# Patient Record
Sex: Male | Born: 1950 | Race: Black or African American | Hispanic: No | Marital: Married | Smoking: Never smoker
Health system: Southern US, Community
[De-identification: ages and names within clinical notes are randomized; demographics above are authoritative.]

## PROBLEM LIST (undated history)

## (undated) DIAGNOSIS — J302 Other seasonal allergic rhinitis: Secondary | ICD-10-CM

## (undated) DIAGNOSIS — J449 Chronic obstructive pulmonary disease, unspecified: Secondary | ICD-10-CM

## (undated) DIAGNOSIS — J45909 Unspecified asthma, uncomplicated: Secondary | ICD-10-CM

## (undated) DIAGNOSIS — G4730 Sleep apnea, unspecified: Secondary | ICD-10-CM

## (undated) DIAGNOSIS — N289 Disorder of kidney and ureter, unspecified: Secondary | ICD-10-CM

## (undated) DIAGNOSIS — J42 Unspecified chronic bronchitis: Secondary | ICD-10-CM

## (undated) DIAGNOSIS — I10 Essential (primary) hypertension: Secondary | ICD-10-CM

## (undated) HISTORY — PX: NECK SURGERY: SHX720

## (undated) HISTORY — PX: OTHER SURGICAL HISTORY: SHX169

## (undated) HISTORY — PX: COLOSTOMY REVERSAL: SHX5782

---

## 2004-01-23 ENCOUNTER — Emergency Department (HOSPITAL_COMMUNITY): Admission: EM | Admit: 2004-01-23 | Discharge: 2004-01-23 | Payer: Self-pay | Admitting: *Deleted

## 2004-01-24 ENCOUNTER — Emergency Department (HOSPITAL_COMMUNITY): Admission: EM | Admit: 2004-01-24 | Discharge: 2004-01-24 | Payer: Self-pay | Admitting: *Deleted

## 2004-02-22 ENCOUNTER — Inpatient Hospital Stay (HOSPITAL_COMMUNITY): Admission: RE | Admit: 2004-02-22 | Discharge: 2004-02-25 | Payer: Self-pay | Admitting: Neurological Surgery

## 2004-03-19 ENCOUNTER — Encounter: Admission: RE | Admit: 2004-03-19 | Discharge: 2004-03-19 | Payer: Self-pay | Admitting: Neurological Surgery

## 2004-04-16 ENCOUNTER — Encounter: Admission: RE | Admit: 2004-04-16 | Discharge: 2004-04-16 | Payer: Self-pay | Admitting: Unknown Physician Specialty

## 2004-06-11 ENCOUNTER — Encounter: Admission: RE | Admit: 2004-06-11 | Discharge: 2004-06-11 | Payer: Self-pay | Admitting: Neurological Surgery

## 2004-06-17 ENCOUNTER — Encounter: Admission: RE | Admit: 2004-06-17 | Discharge: 2004-06-17 | Payer: Self-pay | Admitting: Neurological Surgery

## 2004-09-09 ENCOUNTER — Encounter: Admission: RE | Admit: 2004-09-09 | Discharge: 2004-09-09 | Payer: Self-pay | Admitting: Neurological Surgery

## 2004-11-11 ENCOUNTER — Encounter: Admission: RE | Admit: 2004-11-11 | Discharge: 2004-11-11 | Payer: Self-pay | Admitting: Neurological Surgery

## 2005-02-24 ENCOUNTER — Encounter: Admission: RE | Admit: 2005-02-24 | Discharge: 2005-02-24 | Payer: Self-pay | Admitting: Neurological Surgery

## 2005-03-12 ENCOUNTER — Encounter: Admission: RE | Admit: 2005-03-12 | Discharge: 2005-03-12 | Payer: Self-pay | Admitting: Neurological Surgery

## 2005-03-25 ENCOUNTER — Encounter: Admission: RE | Admit: 2005-03-25 | Discharge: 2005-03-25 | Payer: Self-pay | Admitting: Neurological Surgery

## 2005-11-07 ENCOUNTER — Emergency Department (HOSPITAL_COMMUNITY): Admission: EM | Admit: 2005-11-07 | Discharge: 2005-11-07 | Payer: Self-pay | Admitting: Emergency Medicine

## 2006-04-20 ENCOUNTER — Emergency Department (HOSPITAL_COMMUNITY): Admission: EM | Admit: 2006-04-20 | Discharge: 2006-04-20 | Payer: Self-pay | Admitting: Emergency Medicine
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
## 2007-03-24 ENCOUNTER — Emergency Department (HOSPITAL_COMMUNITY): Admission: EM | Admit: 2007-03-24 | Discharge: 2007-03-24 | Payer: Self-pay | Admitting: Emergency Medicine

## 2007-08-29 ENCOUNTER — Emergency Department (HOSPITAL_COMMUNITY): Admission: EM | Admit: 2007-08-29 | Discharge: 2007-08-29 | Payer: Self-pay | Admitting: Emergency Medicine

## 2007-11-15 ENCOUNTER — Emergency Department (HOSPITAL_BASED_OUTPATIENT_CLINIC_OR_DEPARTMENT_OTHER): Admission: EM | Admit: 2007-11-15 | Discharge: 2007-11-15 | Payer: Self-pay | Admitting: Emergency Medicine

## 2007-11-21 ENCOUNTER — Emergency Department (HOSPITAL_BASED_OUTPATIENT_CLINIC_OR_DEPARTMENT_OTHER): Admission: EM | Admit: 2007-11-21 | Discharge: 2007-11-21 | Payer: Self-pay | Admitting: Emergency Medicine

## 2009-03-02 ENCOUNTER — Emergency Department (HOSPITAL_BASED_OUTPATIENT_CLINIC_OR_DEPARTMENT_OTHER): Admission: EM | Admit: 2009-03-02 | Discharge: 2009-03-02 | Payer: Self-pay | Admitting: Emergency Medicine

## 2009-05-15 ENCOUNTER — Ambulatory Visit: Payer: Self-pay | Admitting: Interventional Radiology

## 2009-05-15 ENCOUNTER — Emergency Department (HOSPITAL_BASED_OUTPATIENT_CLINIC_OR_DEPARTMENT_OTHER): Admission: EM | Admit: 2009-05-15 | Discharge: 2009-05-15 | Payer: Self-pay | Admitting: Emergency Medicine

## 2009-09-12 ENCOUNTER — Ambulatory Visit: Payer: Self-pay | Admitting: Radiology

## 2009-09-12 ENCOUNTER — Emergency Department (HOSPITAL_BASED_OUTPATIENT_CLINIC_OR_DEPARTMENT_OTHER): Admission: EM | Admit: 2009-09-12 | Discharge: 2009-09-12 | Payer: Self-pay | Admitting: Emergency Medicine

## 2009-11-16 ENCOUNTER — Emergency Department (HOSPITAL_BASED_OUTPATIENT_CLINIC_OR_DEPARTMENT_OTHER): Admission: EM | Admit: 2009-11-16 | Discharge: 2009-11-16 | Payer: Self-pay | Admitting: Emergency Medicine

## 2009-11-28 ENCOUNTER — Emergency Department (HOSPITAL_BASED_OUTPATIENT_CLINIC_OR_DEPARTMENT_OTHER): Admission: EM | Admit: 2009-11-28 | Discharge: 2009-11-28 | Payer: Self-pay | Admitting: Emergency Medicine

## 2010-01-05 ENCOUNTER — Emergency Department (HOSPITAL_BASED_OUTPATIENT_CLINIC_OR_DEPARTMENT_OTHER): Admission: EM | Admit: 2010-01-05 | Discharge: 2010-01-05 | Payer: Self-pay | Admitting: Emergency Medicine

## 2010-01-05 ENCOUNTER — Ambulatory Visit: Payer: Self-pay | Admitting: Radiology

## 2010-05-25 ENCOUNTER — Encounter: Payer: Self-pay | Admitting: Neurological Surgery

## 2010-07-23 LAB — URINALYSIS, ROUTINE W REFLEX MICROSCOPIC
Bilirubin Urine: NEGATIVE
Glucose, UA: NEGATIVE mg/dL
Hgb urine dipstick: NEGATIVE
Ketones, ur: NEGATIVE mg/dL
Nitrite: NEGATIVE
Protein, ur: NEGATIVE mg/dL
Specific Gravity, Urine: 1.027 (ref 1.005–1.030)
Urobilinogen, UA: 0.2 mg/dL (ref 0.0–1.0)
pH: 5.5 (ref 5.0–8.0)

## 2010-07-23 LAB — CBC
HCT: 43.8 % (ref 39.0–52.0)
Hemoglobin: 14.4 g/dL (ref 13.0–17.0)
MCHC: 33 g/dL (ref 30.0–36.0)
MCV: 86.9 fL (ref 78.0–100.0)
Platelets: 277 10*3/uL (ref 150–400)
RBC: 5.04 MIL/uL (ref 4.22–5.81)
RDW: 13.8 % (ref 11.5–15.5)
WBC: 8.3 10*3/uL (ref 4.0–10.5)

## 2010-07-23 LAB — COMPREHENSIVE METABOLIC PANEL
ALT: 53 U/L (ref 0–53)
AST: 53 U/L — ABNORMAL HIGH (ref 0–37)
Albumin: 3.9 g/dL (ref 3.5–5.2)
Alkaline Phosphatase: 70 U/L (ref 39–117)
BUN: 16 mg/dL (ref 6–23)
CO2: 27 mEq/L (ref 19–32)
Calcium: 9.4 mg/dL (ref 8.4–10.5)
Chloride: 105 mEq/L (ref 96–112)
Creatinine, Ser: 1.1 mg/dL (ref 0.4–1.5)
GFR calc Af Amer: >60 mL/min (ref >60)
GFR calc non Af Amer: >60 mL/min (ref >60)
Glucose, Bld: 105 mg/dL — ABNORMAL HIGH (ref 70–99)
Potassium: 4.5 mEq/L (ref 3.5–5.1)
Sodium: 142 mEq/L (ref 135–145)
Total Bilirubin: 0.5 mg/dL (ref 0.3–1.2)
Total Protein: 7.7 g/dL (ref 6.0–8.3)

## 2010-07-23 LAB — DIFFERENTIAL
Basophils Absolute: 0.1 10*3/uL (ref 0.0–0.1)
Basophils Relative: 1 % (ref 0–1)
Eosinophils Absolute: 0.6 10*3/uL (ref 0.0–0.7)
Eosinophils Relative: 7 % — ABNORMAL HIGH (ref 0–5)
Lymphocytes Relative: 37 % (ref 12–46)
Lymphs Abs: 3.1 10*3/uL (ref 0.7–4.0)
Monocytes Absolute: 0.8 10*3/uL (ref 0.1–1.0)
Monocytes Relative: 9 % (ref 3–12)
Neutro Abs: 3.7 10*3/uL (ref 1.7–7.7)
Neutrophils Relative %: 47 % (ref 43–77)

## 2010-07-23 LAB — D-DIMER, QUANTITATIVE: D-Dimer, Quant: 0.26 ug/mL-FEU (ref 0.00–0.48)

## 2010-07-23 LAB — POCT B-TYPE NATRIURETIC PEPTIDE (BNP): B Natriuretic Peptide, POC: <5 pg/mL (ref 0–100)

## 2010-09-20 NOTE — Discharge Summary (Signed)
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   PROCEDURES:  Posterior lumbar interbody fusion at L4-5.   BRIEF HISTORY OF PRESENT ILLNESS:  Marvin Romero is a 60 year old black male  who was referred to the neurosurgical unit with complaints of back pain with  leg pain.  He had an MRI which showed degenerative disk disease at L4-5 with  lumbar spinal stenosis at L4-5.  He tried medical management for quite some  time without significant relief.  I recommended a posterior lumbar interbody  fusion at L4-5.  He understood the risks, benefits and alternatives and  wished to proceed.   HOSPITAL COURSE:  The patient was admitted on February 22, 2004, and taken to  the operating room where he underwent a posterior lumbar interbody fusion at  L4-5.  The patient tolerated the procedure well.  He was taken to the  recovery room and then to the floor in stable condition.  For details of the  operative procedure, please see the dictated operative note.  The patient's  hospital course was routine.  There were no complications.  His Foley  catheter was discontinued on the first evening.  He was able to ambulate to  the bathroom without difficulty.  The following day, he was ambulating in  the hallways without difficulty.  He had some back soreness, but no  significant leg pain.  He had no weakness on exam.  His incision was clean,  dry and intact.  His diet was advanced to a regular diet and he tolerated  this well.  He continued on this course and was discharged to home in stable  condition on February 25, 2004.   DISCHARGE MEDICATIONS:  1.  Percocet one to two p.o. q.6h. p.r.n. pain, 100 pills and no refills.  2.  Flexeril 10 mg p.o. t.i.d., 60 pills and two refills.   FOLLOWUP:  His return office visit is in two weeks with Dr. Yetta Barre.       DSJ/MEDQ  D:  02/25/2004  T:  02/25/2004  Job:  [id number]

## 2010-09-20 NOTE — Op Note (Signed)
NAME:  Mccandlish, Lauren            ACCOUNT NO.:  [id number]   MEDICAL RECORD NO.:  [id number]          PATIENT TYPE:  INP   LOCATION:  2899                         FACILITY:  [facility]   PHYSICIAN:  Tia Alert, MD     DATE OF BIRTH:  12/11/1950   DATE OF PROCEDURE:  02/22/2004  DATE OF DISCHARGE:                                 OPERATIVE REPORT   PREOPERATIVE DIAGNOSIS:  Lumbar disk herniation with degenerative disk  disease and lumbar spinal stenosis, L4-5, with back pain and leg pain.   POSTOPERATIVE DIAGNOSIS:  Lumbar disk herniation with degenerative disk  disease and lumbar spinal stenosis, L4-5, with back pain and leg pain.   PROCEDURE:  1.  Decompressive lumbar laminectomy, hemi-facetectomy and foraminotomies,      L4-5, bilaterally, for central canal nerve root decompression.  2.  Posterior lumbar interbody fusion, L4-5, utilizing 10 x 20-mm allograft      bone wedges and locally harvested morcelized autologous bone graft.  3.  Intertransverse arthrodesis at L4-5 utilizing autograft.  4.  Nonsegmental fixation, L4-5, utilizing [employer] pedicle screw and      rod fixation.   SURGEON:  Tia Alert, M.D.   ASSISTANT:  Donalee Citrin, M.D.   ANESTHESIA:  General endotracheal.   COMPLICATIONS:  None apparent.   INDICATION FOR THE PROCEDURE:  Mr. Docken is a 60 year old black male who  was referred with a work-related back injury.  He had a large midline disk  herniation with degenerative disk disease with Modic end-plate changes at L4-  5.  He had tried medical management for quite some without significant  relief.  I recommended a posterior lumbar interbody fusion at L4-5 to  address both his degenerative disk disease, back pain and the lumbar disk  herniation.  He understood the risks, benefits and alternatives, and wished  to proceed.   DESCRIPTION OF THE PROCEDURE:  The patient was taken to the operating room  and after induction of adequate general  endotracheal anesthesia, he was  rolled in a prone position on the Wilson frame and all pressure points were  padded.  His lumbar region was prepped with Duraprep and then draped in the  usual sterile fashion.  Ten milliliters of local anesthesia were injected  and then a dorsal midline incision was made and carried down to the  lumbosacral fascia, which was then opened and the paraspinous musculature  was taken down in a subperiosteal fashion to expose the L4-5 interspace  bilaterally, which carried out the dissection out over the facets and  identified the transverse processes of L4 bilaterally.  Intraoperative  fluoroscopy confirmed my level and then the spinous process was removed with  a Leksell rongeur, and then a complete laminectomy, hemi-facetectomy and  foraminotomies were performed at L4-5.  The yellow ligament was removed.  The underlying L4 and L5 nerve roots were identified, and followed out into  their respective foramina.  The epidural venous vasculature was coagulated  to expose a large subannular disk herniation.  The annulus was incised  bilaterally and a thorough intradiskal diskectomy was performed with  pituitary rongeurs,  curettes, and rotating cutters.  We then distracted the  disk space up to 12 mm and used a 12-mm cutting chisel on the patient's  right side to prepare the endplates for arthrodesis, and then placed a 12 x  20-mm allograft bone wedge into the interspace at L4-5 on the right side.  We then packed the midline from the left side with autograft and then used  the cutting chisel, and placed another 12 x 20-mm allograft bone wedge into  the interspace at L4-5 from the left side.  Once the PLIF was completed, we  turned out attention to the nonsegmental fixation.  We localized the pedicle  screw entry zones at L4 and L5 bilaterally utilizing fluoroscopy and our  knowledge of the anatomy and then probed each pedicle, tapped each pedicle  with a 5.5 tap, and  then placed six 5 x 45-mm pedicle screw into the L4 and  L5 pedicles bilaterally.  We then used 2 lordotic rods and placed these into  the multiaxial screw heads of the pedicle screws, and locked these in  position with the locking caps and anti-torque device after achieving  compression.  We then decorticated the transverse process at L4 on the left  and the lateral part of the facet at L5, and placed autograft out over these  for intertransverse arthrodesis on the left side.  We then irrigated with  copious amounts of Bacitracin-containing saline solution, checked the nerve  roots once again and then closed the muscle and the fascia with interrupted  #1 Vicryl, closed the subcutaneous and subcuticular tissue with 2-0 and 3-0  Vicryl, and closed the skin with Benzoin and Steri-Strips.  A medium Hemovac  drain was placed through a separate stab incision in the subfascial space.  The patient was awakened from general anesthesia and transported to the  recovery room in stable condition.  At the end of the procedure, all sponge,  needle and instrument counts were correct.       DSJ/MEDQ  D:  02/22/2004  T:  02/22/2004  Job:  [id number]

## 2011-01-28 LAB — COMPREHENSIVE METABOLIC PANEL
ALT: 22
Alkaline Phosphatase: 62
CO2: 26
Chloride: 98
GFR calc non Af Amer: 56 — ABNORMAL LOW
Glucose, Bld: 103 — ABNORMAL HIGH
Potassium: 3.5
Sodium: 134 — ABNORMAL LOW
Total Bilirubin: 0.8

## 2011-01-28 LAB — URINALYSIS, ROUTINE W REFLEX MICROSCOPIC
Bilirubin Urine: NEGATIVE
Hgb urine dipstick: NEGATIVE
Specific Gravity, Urine: 1.035 — ABNORMAL HIGH
pH: 5.5

## 2011-05-01 ENCOUNTER — Other Ambulatory Visit: Payer: Self-pay

## 2011-05-01 ENCOUNTER — Emergency Department (HOSPITAL_BASED_OUTPATIENT_CLINIC_OR_DEPARTMENT_OTHER)
Admission: EM | Admit: 2011-05-01 | Discharge: 2011-05-01 | Disposition: A | Discharge status: Home / Self Care | Payer: Medicare Other | Attending: Emergency Medicine | Admitting: Emergency Medicine

## 2011-05-01 DIAGNOSIS — R1110 Vomiting, unspecified: Secondary | ICD-10-CM | POA: Insufficient documentation

## 2011-05-01 DIAGNOSIS — R42 Dizziness and giddiness: Secondary | ICD-10-CM | POA: Insufficient documentation

## 2011-05-01 DIAGNOSIS — I10 Essential (primary) hypertension: Secondary | ICD-10-CM | POA: Insufficient documentation

## 2011-05-01 HISTORY — DX: Essential (primary) hypertension: I10

## 2011-05-01 MED ORDER — MECLIZINE HCL 25 MG PO TABS
ORAL_TABLET | ORAL | Status: AC
  Administered 2011-05-01: 25 mg
  Filled 2011-05-01: qty 1

## 2011-05-01 MED ORDER — MECLIZINE HCL 25 MG PO TABS: 25.0000 mg | ORAL_TABLET | Freq: Three times a day (TID) | ORAL | Status: AC | PRN

## 2011-05-01 NOTE — ED Notes (Signed)
Pt reports he awakened this am with dizziness and feeling "light headed".  Denies dizziness now.

## 2011-05-01 NOTE — ED Notes (Signed)
Pt vomited moderate amount of emesis.

## 2011-05-01 NOTE — ED Notes (Signed)
MD at bedside. 

## 2011-05-01 NOTE — ED Provider Notes (Signed)
History     CSN: [id number]  Arrival date & time 05/01/11  0716   First MD Initiated Contact with Patient 05/01/11 [phone]      Chief Complaint  Patient presents with  . Dizziness    (Consider location/radiation/quality/duration/timing/severity/associated sxs/prior treatment) HPI Comments: Patient states that he woke this morning feeling dizzy, like the room was spinning.  This lasted several minutes, then resolved.  He was on his way to the gym and stopped here to be checked.  He denies chest pain, shortness of breath.  No abd pain.  No ill contacts.  Patient is a 60 y.o. male presenting with vomiting. The history is provided by the patient.  Emesis  This is a new problem. The current episode started less than 1 hour ago. The problem has been resolved. The emesis has an appearance of stomach contents. There has been no fever. Pertinent negatives include no chills, no fever and no headaches.    Past Medical History  Diagnosis Date  . Hypertension     Past Surgical History  Procedure Date  . Colonostomy     No family history on file.  History  Substance Use Topics  . Smoking status: Never Smoker   . Smokeless tobacco: Never Used  . Alcohol Use: No      Review of Systems  Constitutional: Negative for fever and chills.  HENT: Negative for neck pain.   Gastrointestinal: Positive for vomiting.  Neurological: Positive for dizziness. Negative for headaches.  All other systems reviewed and are negative.    Allergies  Review of patient's allergies indicates no known allergies.  Home Medications  No current outpatient prescriptions on file.  BP 143/99  Pulse 77  Temp(Src) 97.5 F (36.4 C) (Oral)  Resp 16  Ht 6' (1.829 m)  Wt 215 lb (97.523 kg)  BMI 29.16 kg/m2  SpO2 99%  Physical Exam  Nursing note and vitals reviewed. Constitutional: He is oriented to person, place, and time. He appears well-developed and well-nourished. No distress.  HENT:  Head:  Normocephalic and atraumatic.  Right Ear: External ear normal.  Left Ear: External ear normal.  Mouth/Throat: Oropharynx is clear and moist. No oropharyngeal exudate.  Eyes: EOM are normal. Pupils are equal, round, and reactive to light.  Neck: Normal range of motion. Neck supple.  Cardiovascular: Normal rate and regular rhythm.   No murmur heard. Pulmonary/Chest: Effort normal and breath sounds normal. No respiratory distress. He has no wheezes.  Abdominal: Soft. Bowel sounds are normal. He exhibits no distension. There is no tenderness.  Musculoskeletal: Normal range of motion. He exhibits no edema.  Lymphadenopathy:    He has no cervical adenopathy.  Neurological: He is alert and oriented to person, place, and time. No cranial nerve deficit. Coordination normal.       There is no nystagmus, either vertical or horizontal.    Skin: Skin is warm and dry. He is not diaphoretic.    ED Course  Procedures (including critical care time)  Labs Reviewed - No data to display No results found.   No diagnosis found.   Date: 05/01/2011  Rate: 76  Rhythm: normal sinus rhythm  QRS Axis: normal  Intervals: normal  ST/T Wave abnormalities: normal  Conduction Disutrbances:none  Narrative Interpretation:   Old EKG Reviewed: none available    MDM  The patient arrived to the ED after experiencing dizzy episode at home that sounds like peripheral vertigo.  While being examined, he became nauseated and vomited.  He then  felt much better and did not want to undergo any tests.  He wanted to be discharged.  He did have an ekg done which was normal.  I explained to him that I could not rule out more sinister causes of dizziness without doing further testing, however he still wanted to go home, stating that he would come back if he got worse.          Geoffery Lyons, MD 05/01/11 [phone]

## 2012-05-21 ENCOUNTER — Emergency Department (HOSPITAL_BASED_OUTPATIENT_CLINIC_OR_DEPARTMENT_OTHER)
Admission: EM | Admit: 2012-05-21 | Discharge: 2012-05-21 | Disposition: A | Discharge status: Home / Self Care | Payer: Medicare HMO | Attending: Emergency Medicine | Admitting: Emergency Medicine

## 2012-05-21 ENCOUNTER — Encounter (HOSPITAL_BASED_OUTPATIENT_CLINIC_OR_DEPARTMENT_OTHER): Payer: Self-pay | Admitting: Emergency Medicine

## 2012-05-21 ENCOUNTER — Emergency Department (HOSPITAL_BASED_OUTPATIENT_CLINIC_OR_DEPARTMENT_OTHER): Payer: Medicare HMO

## 2012-05-21 DIAGNOSIS — J9801 Acute bronchospasm: Secondary | ICD-10-CM | POA: Insufficient documentation

## 2012-05-21 DIAGNOSIS — R059 Cough, unspecified: Secondary | ICD-10-CM | POA: Insufficient documentation

## 2012-05-21 DIAGNOSIS — Z8669 Personal history of other diseases of the nervous system and sense organs: Secondary | ICD-10-CM | POA: Insufficient documentation

## 2012-05-21 DIAGNOSIS — R062 Wheezing: Secondary | ICD-10-CM | POA: Insufficient documentation

## 2012-05-21 DIAGNOSIS — R05 Cough: Secondary | ICD-10-CM | POA: Insufficient documentation

## 2012-05-21 DIAGNOSIS — J40 Bronchitis, not specified as acute or chronic: Secondary | ICD-10-CM

## 2012-05-21 DIAGNOSIS — I10 Essential (primary) hypertension: Secondary | ICD-10-CM | POA: Insufficient documentation

## 2012-05-21 HISTORY — DX: Sleep apnea, unspecified: G47.30

## 2012-05-21 MED ORDER — PREDNISONE 20 MG PO TABS: 60.0000 mg | ORAL_TABLET | Freq: Every day | ORAL | Status: DC

## 2012-05-21 MED ORDER — ALBUTEROL SULFATE HFA 108 (90 BASE) MCG/ACT IN AERS: 2.0000 | INHALATION_SPRAY | RESPIRATORY_TRACT | Status: DC | PRN

## 2012-05-21 MED ORDER — ALBUTEROL SULFATE (5 MG/ML) 0.5% IN NEBU
5.0000 mg | INHALATION_SOLUTION | Freq: Once | RESPIRATORY_TRACT | Status: AC
  Administered 2012-05-21: 5 mg via RESPIRATORY_TRACT
  Filled 2012-05-21: qty 1

## 2012-05-21 MED ORDER — PREDNISONE 50 MG PO TABS
60.0000 mg | ORAL_TABLET | Freq: Once | ORAL | Status: AC
  Administered 2012-05-21: 60 mg via ORAL
  Filled 2012-05-21: qty 1

## 2012-05-21 MED ORDER — ALBUTEROL SULFATE HFA 108 (90 BASE) MCG/ACT IN AERS
INHALATION_SPRAY | RESPIRATORY_TRACT | Status: AC
  Administered 2012-05-21
  Filled 2012-05-21: qty 6.7

## 2012-05-21 MED ORDER — IPRATROPIUM BROMIDE 0.02 % IN SOLN
0.5000 mg | Freq: Once | RESPIRATORY_TRACT | Status: AC
  Administered 2012-05-21: 0.5 mg via RESPIRATORY_TRACT
  Filled 2012-05-21: qty 2.5

## 2012-05-21 MED ORDER — ALBUTEROL SULFATE (5 MG/ML) 0.5% IN NEBU
2.5000 mg | INHALATION_SOLUTION | Freq: Once | RESPIRATORY_TRACT | Status: AC
  Administered 2012-05-21: 2.5 mg via RESPIRATORY_TRACT
  Filled 2012-05-21: qty 0.5

## 2012-05-21 NOTE — ED Notes (Signed)
Shortness of breath, cough and wheezing since 04/18/12.  Worse in the last couple of days.
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
## 2012-12-19 ENCOUNTER — Emergency Department (HOSPITAL_BASED_OUTPATIENT_CLINIC_OR_DEPARTMENT_OTHER)
Admission: EM | Admit: 2012-12-19 | Discharge: 2012-12-19 | Disposition: A | Discharge status: Home / Self Care | Payer: Medicare HMO | Attending: Emergency Medicine | Admitting: Emergency Medicine

## 2012-12-19 ENCOUNTER — Emergency Department (HOSPITAL_BASED_OUTPATIENT_CLINIC_OR_DEPARTMENT_OTHER): Payer: Medicare HMO

## 2012-12-19 ENCOUNTER — Encounter (HOSPITAL_BASED_OUTPATIENT_CLINIC_OR_DEPARTMENT_OTHER): Payer: Self-pay

## 2012-12-19 DIAGNOSIS — R05 Cough: Secondary | ICD-10-CM | POA: Insufficient documentation

## 2012-12-19 DIAGNOSIS — R062 Wheezing: Secondary | ICD-10-CM | POA: Insufficient documentation

## 2012-12-19 DIAGNOSIS — J9801 Acute bronchospasm: Secondary | ICD-10-CM | POA: Insufficient documentation

## 2012-12-19 DIAGNOSIS — R059 Cough, unspecified: Secondary | ICD-10-CM | POA: Insufficient documentation

## 2012-12-19 DIAGNOSIS — T7840XA Allergy, unspecified, initial encounter: Secondary | ICD-10-CM

## 2012-12-19 DIAGNOSIS — I10 Essential (primary) hypertension: Secondary | ICD-10-CM | POA: Insufficient documentation

## 2012-12-19 DIAGNOSIS — L272 Dermatitis due to ingested food: Secondary | ICD-10-CM | POA: Insufficient documentation

## 2012-12-19 HISTORY — DX: Other seasonal allergic rhinitis: J30.2

## 2012-12-19 LAB — CBC WITH DIFFERENTIAL/PLATELET
Basophils Absolute: 0 10*3/uL (ref 0.0–0.1)
Lymphocytes Relative: 52 % — ABNORMAL HIGH (ref 12–46)
Lymphs Abs: 6.6 10*3/uL — ABNORMAL HIGH (ref 0.7–4.0)
MCV: 86.4 fL (ref 78.0–100.0)
Monocytes Relative: 9 % (ref 3–12)
Platelets: 217 10*3/uL (ref 150–400)
RDW: 14.7 % (ref 11.5–15.5)
WBC: 12.7 10*3/uL — ABNORMAL HIGH (ref 4.0–10.5)

## 2012-12-19 LAB — TROPONIN I: Troponin I: <0.3 ng/mL (ref <0.30)

## 2012-12-19 LAB — COMPREHENSIVE METABOLIC PANEL
Albumin: 3.7 g/dL (ref 3.5–5.2)
Alkaline Phosphatase: 72 U/L (ref 39–117)
BUN: 10 mg/dL (ref 6–23)
Creatinine, Ser: 1.1 mg/dL (ref 0.50–1.35)
Potassium: 3.5 mEq/L (ref 3.5–5.1)
Total Protein: 7.2 g/dL (ref 6.0–8.3)

## 2012-12-19 LAB — PRO B NATRIURETIC PEPTIDE: Pro B Natriuretic peptide (BNP): 22 pg/mL (ref 0–125)

## 2012-12-19 MED ORDER — ALBUTEROL SULFATE (5 MG/ML) 0.5% IN NEBU
INHALATION_SOLUTION | RESPIRATORY_TRACT | Status: AC
  Administered 2012-12-19: 5 mg via RESPIRATORY_TRACT
  Filled 2012-12-19: qty 1

## 2012-12-19 MED ORDER — IPRATROPIUM BROMIDE 0.02 % IN SOLN: 0.5000 mg | Freq: Once | RESPIRATORY_TRACT | Status: DC

## 2012-12-19 MED ORDER — ALBUTEROL SULFATE (5 MG/ML) 0.5% IN NEBU
15.0000 mg | INHALATION_SOLUTION | RESPIRATORY_TRACT | Status: DC
  Administered 2012-12-19: 15 mg via RESPIRATORY_TRACT

## 2012-12-19 MED ORDER — ALBUTEROL SULFATE HFA 108 (90 BASE) MCG/ACT IN AERS: 2.0000 | INHALATION_SPRAY | RESPIRATORY_TRACT | Status: DC | PRN

## 2012-12-19 MED ORDER — PREDNISONE 50 MG PO TABS: ORAL_TABLET | ORAL | Status: DC

## 2012-12-19 MED ORDER — EPINEPHRINE 0.3 MG/0.3ML IJ SOAJ: 0.3000 mg | INTRAMUSCULAR | Status: DC | PRN

## 2012-12-19 MED ORDER — LEVOFLOXACIN IN D5W 500 MG/100ML IV SOLN: 500.0000 mg | Freq: Once | INTRAVENOUS | Status: DC

## 2012-12-19 MED ORDER — PREDNISONE 50 MG PO TABS
60.0000 mg | ORAL_TABLET | Freq: Once | ORAL | Status: AC
  Administered 2012-12-19: 60 mg via ORAL
  Filled 2012-12-19: qty 1

## 2012-12-19 MED ORDER — ALBUTEROL SULFATE (5 MG/ML) 0.5% IN NEBU
5.0000 mg | INHALATION_SOLUTION | Freq: Once | RESPIRATORY_TRACT | Status: AC
  Administered 2012-12-19: 5 mg via RESPIRATORY_TRACT

## 2012-12-19 MED ORDER — MAGNESIUM SULFATE 50 % IJ SOLN
2.0000 g | Freq: Once | INTRAMUSCULAR | Status: AC
  Administered 2012-12-19: 2 g via INTRAVENOUS
  Filled 2012-12-19: qty 4

## 2012-12-19 MED ORDER — ALBUTEROL SULFATE (5 MG/ML) 0.5% IN NEBU
INHALATION_SOLUTION | RESPIRATORY_TRACT | Status: AC
  Filled 2012-12-19: qty 0.5

## 2012-12-19 NOTE — ED Notes (Signed)
Patient reports that he was awakened at midnight with a feeling of mucus stuck in his throat. On arrival audible wheezing and dry cough with tachypnea. Speaking complete sentences. RT and MD at bedside on arrival

## 2012-12-19 NOTE — ED Notes (Signed)
Walked patient with pulse ox around dept. Patient sats were 96-97%. Heart rate of 113. Patient in no distress or SOB. Returned patient back to bed and placed on monitor. RN notified.

## 2012-12-19 NOTE — ED Notes (Signed)
Continuous neb in progress. No distress. States he feels much better. 02 sats 97%
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
## 2012-12-19 NOTE — ED Notes (Signed)
MD with pt  

## 2012-12-20 LAB — PATHOLOGIST SMEAR REVIEW

## 2013-02-28 DIAGNOSIS — K219 Gastro-esophageal reflux disease without esophagitis: Secondary | ICD-10-CM | POA: Insufficient documentation

## 2014-01-04 ENCOUNTER — Emergency Department (HOSPITAL_BASED_OUTPATIENT_CLINIC_OR_DEPARTMENT_OTHER)
Admission: EM | Admit: 2014-01-04 | Discharge: 2014-01-04 | Disposition: A | Discharge status: Home / Self Care | Payer: No Typology Code available for payment source | Attending: Emergency Medicine | Admitting: Emergency Medicine

## 2014-01-04 ENCOUNTER — Emergency Department (HOSPITAL_BASED_OUTPATIENT_CLINIC_OR_DEPARTMENT_OTHER): Payer: No Typology Code available for payment source

## 2014-01-04 ENCOUNTER — Encounter (HOSPITAL_BASED_OUTPATIENT_CLINIC_OR_DEPARTMENT_OTHER): Payer: Self-pay | Admitting: Emergency Medicine

## 2014-01-04 DIAGNOSIS — S6990XA Unspecified injury of unspecified wrist, hand and finger(s), initial encounter: Secondary | ICD-10-CM

## 2014-01-04 DIAGNOSIS — Z8709 Personal history of other diseases of the respiratory system: Secondary | ICD-10-CM | POA: Diagnosis not present

## 2014-01-04 DIAGNOSIS — S5010XA Contusion of unspecified forearm, initial encounter: Secondary | ICD-10-CM | POA: Insufficient documentation

## 2014-01-04 DIAGNOSIS — IMO0002 Reserved for concepts with insufficient information to code with codable children: Secondary | ICD-10-CM | POA: Diagnosis not present

## 2014-01-04 DIAGNOSIS — Y92410 Unspecified street and highway as the place of occurrence of the external cause: Secondary | ICD-10-CM | POA: Diagnosis not present

## 2014-01-04 DIAGNOSIS — I10 Essential (primary) hypertension: Secondary | ICD-10-CM | POA: Diagnosis not present

## 2014-01-04 DIAGNOSIS — S5012XA Contusion of left forearm, initial encounter: Secondary | ICD-10-CM

## 2014-01-04 DIAGNOSIS — Y9389 Activity, other specified: Secondary | ICD-10-CM | POA: Insufficient documentation

## 2014-01-04 DIAGNOSIS — S59909A Unspecified injury of unspecified elbow, initial encounter: Secondary | ICD-10-CM | POA: Insufficient documentation

## 2014-01-04 DIAGNOSIS — S59919A Unspecified injury of unspecified forearm, initial encounter: Secondary | ICD-10-CM

## 2014-01-04 MED ORDER — IBUPROFEN 800 MG PO TABS: 800.0000 mg | ORAL_TABLET | Freq: Three times a day (TID) | ORAL | Status: DC
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
## 2014-01-04 NOTE — Discharge Instructions (Signed)
Contusion °A contusion is a deep bruise. Contusions are the result of an injury that caused bleeding under the skin. The contusion may turn blue, purple, or yellow. Minor injuries will give you a painless contusion, but more severe contusions may stay painful and swollen for a few weeks.  °CAUSES  °A contusion is usually caused by a blow, trauma, or direct force to an area of the body. °SYMPTOMS  °· Swelling and redness of the injured area. °· Bruising of the injured area. °· Tenderness and soreness of the injured area. °· Pain. °DIAGNOSIS  °The diagnosis can be made by taking a history and physical exam. An X-ray, CT scan, or MRI may be needed to determine if there were any associated injuries, such as fractures. °TREATMENT  °Specific treatment will depend on what area of the body was injured. In general, the best treatment for a contusion is resting, icing, elevating, and applying cold compresses to the injured area. Over-the-counter medicines may also be recommended for pain control. Ask your caregiver what the best treatment is for your contusion. °HOME CARE INSTRUCTIONS  °· Put ice on the injured area. °¨ Put ice in a plastic bag. °¨ Place a towel between your skin and the bag. °¨ Leave the ice on for 15-20 minutes, 3-4 times a day, or as directed by your health care provider. °· Only take over-the-counter or prescription medicines for pain, discomfort, or fever as directed by your caregiver. Your caregiver may recommend avoiding anti-inflammatory medicines (aspirin, ibuprofen, and naproxen) for 48 hours because these medicines may increase bruising. °· Rest the injured area. °· If possible, elevate the injured area to reduce swelling. °SEEK IMMEDIATE MEDICAL CARE IF:  °· You have increased bruising or swelling. °· You have pain that is getting worse. °· Your swelling or pain is not relieved with medicines. °MAKE SURE YOU:  °· Understand these instructions. °· Will watch your condition. °· Will get help right  away if you are not doing well or get worse. °Document Released: 01/29/2005 Document Revised: 04/26/2013 Document Reviewed: 02/24/2011 °[employer]® Patient Information ©2015 [employer]. This information is not intended to replace advice given to you by your health care provider. Make sure you discuss any questions you have with your health care provider. ° °

## 2014-01-05 NOTE — ED Provider Notes (Signed)
Medical screening examination/treatment/procedure(s) were performed by non-physician practitioner and as supervising physician I was immediately available for consultation/collaboration.   EKG Interpretation None       Doug Sou, MD 01/05/14 2505

## 2014-06-28 ENCOUNTER — Emergency Department (HOSPITAL_BASED_OUTPATIENT_CLINIC_OR_DEPARTMENT_OTHER): Payer: Medicare HMO

## 2014-06-28 ENCOUNTER — Encounter (HOSPITAL_BASED_OUTPATIENT_CLINIC_OR_DEPARTMENT_OTHER): Payer: Self-pay

## 2014-06-28 ENCOUNTER — Emergency Department (HOSPITAL_BASED_OUTPATIENT_CLINIC_OR_DEPARTMENT_OTHER)
Admission: EM | Admit: 2014-06-28 | Discharge: 2014-06-28 | Disposition: A | Discharge status: Home / Self Care | Payer: Medicare HMO | Attending: Emergency Medicine | Admitting: Emergency Medicine

## 2014-06-28 DIAGNOSIS — R0602 Shortness of breath: Secondary | ICD-10-CM | POA: Diagnosis present

## 2014-06-28 DIAGNOSIS — J40 Bronchitis, not specified as acute or chronic: Secondary | ICD-10-CM

## 2014-06-28 DIAGNOSIS — R000 Tachycardia, unspecified: Secondary | ICD-10-CM | POA: Insufficient documentation

## 2014-06-28 DIAGNOSIS — I10 Essential (primary) hypertension: Secondary | ICD-10-CM | POA: Diagnosis not present

## 2014-06-28 DIAGNOSIS — Z8669 Personal history of other diseases of the nervous system and sense organs: Secondary | ICD-10-CM | POA: Diagnosis not present

## 2014-06-28 DIAGNOSIS — J209 Acute bronchitis, unspecified: Secondary | ICD-10-CM | POA: Insufficient documentation

## 2014-06-28 LAB — CBC WITH DIFFERENTIAL/PLATELET
Basophils Absolute: 0.1 10*3/uL (ref 0.0–0.1)
Basophils Relative: 1 % (ref 0–1)
EOS ABS: 1 10*3/uL — AB (ref 0.0–0.7)
EOS PCT: 8 % — AB (ref 0–5)
HCT: 38 % — ABNORMAL LOW (ref 39.0–52.0)
HEMOGLOBIN: 12.3 g/dL — AB (ref 13.0–17.0)
LYMPHS ABS: 2.5 10*3/uL (ref 0.7–4.0)
Lymphocytes Relative: 20 % (ref 12–46)
MCH: 28.2 pg (ref 26.0–34.0)
MCHC: 32.4 g/dL (ref 30.0–36.0)
MCV: 87.2 fL (ref 78.0–100.0)
MONO ABS: 1.2 10*3/uL — AB (ref 0.1–1.0)
MONOS PCT: 9 % (ref 3–12)
NEUTROS PCT: 62 % (ref 43–77)
Neutro Abs: 8 10*3/uL — ABNORMAL HIGH (ref 1.7–7.7)
Platelets: 339 10*3/uL (ref 150–400)
RBC: 4.36 MIL/uL (ref 4.22–5.81)
RDW: 13.6 % (ref 11.5–15.5)
WBC: 12.7 10*3/uL — AB (ref 4.0–10.5)

## 2014-06-28 LAB — TROPONIN I: Troponin I: 0.03 ng/mL (ref <0.031)

## 2014-06-28 LAB — BASIC METABOLIC PANEL
ANION GAP: 3 — AB (ref 5–15)
BUN: 9 mg/dL (ref 6–23)
CALCIUM: 8.6 mg/dL (ref 8.4–10.5)
CO2: 27 mmol/L (ref 19–32)
Chloride: 106 mmol/L (ref 96–112)
Creatinine, Ser: 1.18 mg/dL (ref 0.50–1.35)
GFR, EST AFRICAN AMERICAN: 75 mL/min — AB (ref >90)
GFR, EST NON AFRICAN AMERICAN: 65 mL/min — AB (ref >90)
Glucose, Bld: 102 mg/dL — ABNORMAL HIGH (ref 70–99)
POTASSIUM: 3.6 mmol/L (ref 3.5–5.1)
SODIUM: 136 mmol/L (ref 135–145)

## 2014-06-28 LAB — BRAIN NATRIURETIC PEPTIDE: B NATRIURETIC PEPTIDE 5: 16.1 pg/mL (ref 0.0–100.0)

## 2014-06-28 MED ORDER — PREDNISONE 50 MG PO TABS: ORAL_TABLET | ORAL | Status: DC

## 2014-06-28 MED ORDER — IPRATROPIUM-ALBUTEROL 0.5-2.5 (3) MG/3ML IN SOLN
3.0000 mL | RESPIRATORY_TRACT | Status: DC
  Administered 2014-06-28: 3 mL via RESPIRATORY_TRACT
  Filled 2014-06-28: qty 3

## 2014-06-28 MED ORDER — METHYLPREDNISOLONE SODIUM SUCC 125 MG IJ SOLR
125.0000 mg | Freq: Once | INTRAMUSCULAR | Status: AC
  Administered 2014-06-28: 125 mg via INTRAVENOUS
  Filled 2014-06-28: qty 2

## 2014-06-28 MED ORDER — ALBUTEROL (5 MG/ML) CONTINUOUS INHALATION SOLN
INHALATION_SOLUTION | RESPIRATORY_TRACT | Status: AC
  Administered 2014-06-28: 10 mg/h via RESPIRATORY_TRACT
  Filled 2014-06-28: qty 20

## 2014-06-28 MED ORDER — DOXYCYCLINE HYCLATE 100 MG PO CAPS: 100.0000 mg | ORAL_CAPSULE | Freq: Two times a day (BID) | ORAL | Status: DC

## 2014-06-28 MED ORDER — ALBUTEROL (5 MG/ML) CONTINUOUS INHALATION SOLN: 10.0000 mg/h | INHALATION_SOLUTION | RESPIRATORY_TRACT | Status: AC

## 2014-06-28 MED ORDER — ALBUTEROL SULFATE HFA 108 (90 BASE) MCG/ACT IN AERS: 1.0000 | INHALATION_SPRAY | Freq: Four times a day (QID) | RESPIRATORY_TRACT | Status: DC | PRN

## 2014-06-28 NOTE — ED Notes (Signed)
MD at bedside. 

## 2014-06-28 NOTE — ED Notes (Signed)
Patient ambulated in hallway on room air.  HR 120-126 after HHN, SpO2 96-98%, RR 22-26.  Patient states "Feels much better"
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
## 2014-06-28 NOTE — Discharge Instructions (Signed)

## 2014-06-28 NOTE — ED Notes (Signed)
SOB x 3 hour-prod cough

## 2014-06-28 NOTE — ED Notes (Signed)
Portable xray at bedside.

## 2014-06-28 NOTE — ED Notes (Signed)
RT at bedside for IV start.

## 2014-09-07 DIAGNOSIS — J984 Other disorders of lung: Secondary | ICD-10-CM | POA: Insufficient documentation

## 2014-10-30 DIAGNOSIS — I10 Essential (primary) hypertension: Secondary | ICD-10-CM | POA: Insufficient documentation

## 2014-10-30 DIAGNOSIS — G4733 Obstructive sleep apnea (adult) (pediatric): Secondary | ICD-10-CM | POA: Insufficient documentation

## 2015-10-30 ENCOUNTER — Emergency Department (HOSPITAL_BASED_OUTPATIENT_CLINIC_OR_DEPARTMENT_OTHER): Payer: Medicare HMO

## 2015-10-30 ENCOUNTER — Encounter (HOSPITAL_BASED_OUTPATIENT_CLINIC_OR_DEPARTMENT_OTHER): Payer: Self-pay | Admitting: Emergency Medicine

## 2015-10-30 ENCOUNTER — Emergency Department (HOSPITAL_BASED_OUTPATIENT_CLINIC_OR_DEPARTMENT_OTHER)
Admission: EM | Admit: 2015-10-30 | Discharge: 2015-10-30 | Disposition: A | Discharge status: Home / Self Care | Payer: Medicare HMO | Attending: Emergency Medicine | Admitting: Emergency Medicine

## 2015-10-30 DIAGNOSIS — IMO0001 Reserved for inherently not codable concepts without codable children: Secondary | ICD-10-CM

## 2015-10-30 DIAGNOSIS — M545 Low back pain, unspecified: Secondary | ICD-10-CM

## 2015-10-30 DIAGNOSIS — I10 Essential (primary) hypertension: Secondary | ICD-10-CM | POA: Insufficient documentation

## 2015-10-30 DIAGNOSIS — Y9389 Activity, other specified: Secondary | ICD-10-CM | POA: Insufficient documentation

## 2015-10-30 DIAGNOSIS — Y999 Unspecified external cause status: Secondary | ICD-10-CM | POA: Insufficient documentation

## 2015-10-30 DIAGNOSIS — Y92410 Unspecified street and highway as the place of occurrence of the external cause: Secondary | ICD-10-CM | POA: Insufficient documentation

## 2015-10-30 DIAGNOSIS — R030 Elevated blood-pressure reading, without diagnosis of hypertension: Secondary | ICD-10-CM

## 2015-10-30 DIAGNOSIS — J45909 Unspecified asthma, uncomplicated: Secondary | ICD-10-CM | POA: Diagnosis not present

## 2015-10-30 HISTORY — DX: Unspecified asthma, uncomplicated: J45.909

## 2015-10-30 MED ORDER — NAPROXEN 250 MG PO TABS
500.0000 mg | ORAL_TABLET | Freq: Once | ORAL | Status: AC
  Administered 2015-10-30: 500 mg via ORAL

## 2015-10-30 MED ORDER — HYDROCODONE-ACETAMINOPHEN 5-325 MG PO TABS: 1.0000 | ORAL_TABLET | Freq: Four times a day (QID) | ORAL | Status: DC | PRN

## 2015-10-30 MED ORDER — NAPROXEN 500 MG PO TABS: 500.0000 mg | ORAL_TABLET | Freq: Two times a day (BID) | ORAL | Status: DC

## 2015-10-30 MED ORDER — NAPROXEN 250 MG PO TABS
ORAL_TABLET | ORAL | Status: AC
  Filled 2015-10-30: qty 2
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
## 2016-02-18 ENCOUNTER — Emergency Department (HOSPITAL_BASED_OUTPATIENT_CLINIC_OR_DEPARTMENT_OTHER)
Admission: EM | Admit: 2016-02-18 | Discharge: 2016-02-19 | Disposition: A | Discharge status: Home / Self Care | Payer: Medicare HMO | Attending: Emergency Medicine | Admitting: Emergency Medicine

## 2016-02-18 DIAGNOSIS — J40 Bronchitis, not specified as acute or chronic: Secondary | ICD-10-CM | POA: Insufficient documentation

## 2016-02-18 DIAGNOSIS — Z79899 Other long term (current) drug therapy: Secondary | ICD-10-CM | POA: Diagnosis not present

## 2016-02-18 DIAGNOSIS — I10 Essential (primary) hypertension: Secondary | ICD-10-CM | POA: Insufficient documentation

## 2016-02-18 DIAGNOSIS — Z7951 Long term (current) use of inhaled steroids: Secondary | ICD-10-CM | POA: Insufficient documentation

## 2016-02-18 DIAGNOSIS — R0602 Shortness of breath: Secondary | ICD-10-CM | POA: Diagnosis present

## 2016-02-19 ENCOUNTER — Emergency Department (HOSPITAL_BASED_OUTPATIENT_CLINIC_OR_DEPARTMENT_OTHER): Payer: Medicare HMO

## 2016-02-19 ENCOUNTER — Encounter (HOSPITAL_BASED_OUTPATIENT_CLINIC_OR_DEPARTMENT_OTHER): Payer: Self-pay

## 2016-02-19 LAB — CBC WITH DIFFERENTIAL/PLATELET
BASOS ABS: 0 10*3/uL (ref 0.0–0.1)
BASOS PCT: 0 %
EOS ABS: 0.9 10*3/uL — AB (ref 0.0–0.7)
EOS PCT: 8 %
HCT: 36.3 % — ABNORMAL LOW (ref 39.0–52.0)
Hemoglobin: 12.2 g/dL — ABNORMAL LOW (ref 13.0–17.0)
LYMPHS PCT: 47 %
Lymphs Abs: 4.9 10*3/uL — ABNORMAL HIGH (ref 0.7–4.0)
MCH: 28.8 pg (ref 26.0–34.0)
MCHC: 33.6 g/dL (ref 30.0–36.0)
MCV: 85.8 fL (ref 78.0–100.0)
MONO ABS: 1 10*3/uL (ref 0.1–1.0)
Monocytes Relative: 9 %
Neutro Abs: 3.8 10*3/uL (ref 1.7–7.7)
Neutrophils Relative %: 36 %
PLATELETS: 262 10*3/uL (ref 150–400)
RBC: 4.23 MIL/uL (ref 4.22–5.81)
RDW: 14.8 % (ref 11.5–15.5)
WBC: 10.5 10*3/uL (ref 4.0–10.5)

## 2016-02-19 LAB — BASIC METABOLIC PANEL
Anion gap: 6 (ref 5–15)
BUN: 9 mg/dL (ref 6–20)
CO2: 28 mmol/L (ref 22–32)
CREATININE: 1.2 mg/dL (ref 0.61–1.24)
Calcium: 9 mg/dL (ref 8.9–10.3)
Chloride: 105 mmol/L (ref 101–111)
GFR calc Af Amer: >60 mL/min (ref >60)
GLUCOSE: 132 mg/dL — AB (ref 65–99)
POTASSIUM: 3.1 mmol/L — AB (ref 3.5–5.1)
SODIUM: 139 mmol/L (ref 135–145)

## 2016-02-19 LAB — TROPONIN I
Troponin I: 0.04 ng/mL (ref <0.03)
Troponin I: 0.04 ng/mL (ref <0.03)

## 2016-02-19 MED ORDER — ALBUTEROL SULFATE (2.5 MG/3ML) 0.083% IN NEBU
2.5000 mg | INHALATION_SOLUTION | Freq: Once | RESPIRATORY_TRACT | Status: AC
  Administered 2016-02-19: 2.5 mg via RESPIRATORY_TRACT
  Filled 2016-02-19: qty 3

## 2016-02-19 MED ORDER — BENZONATATE 100 MG PO CAPS
200.0000 mg | ORAL_CAPSULE | Freq: Once | ORAL | Status: AC
  Administered 2016-02-19: 200 mg via ORAL
  Filled 2016-02-19: qty 2

## 2016-02-19 MED ORDER — PREDNISONE 20 MG PO TABS: ORAL_TABLET | ORAL | 0 refills | Status: DC

## 2016-02-19 MED ORDER — POTASSIUM CHLORIDE CRYS ER 20 MEQ PO TBCR
40.0000 meq | EXTENDED_RELEASE_TABLET | Freq: Once | ORAL | Status: AC
  Administered 2016-02-19: 40 meq via ORAL
  Filled 2016-02-19: qty 2

## 2016-02-19 MED ORDER — LORATADINE 10 MG PO TABS: 10.0000 mg | ORAL_TABLET | Freq: Every day | ORAL | 0 refills | Status: DC

## 2016-02-19 MED ORDER — BENZONATATE 100 MG PO CAPS: 100.0000 mg | ORAL_CAPSULE | Freq: Three times a day (TID) | ORAL | 0 refills | Status: DC

## 2016-02-19 MED ORDER — GUAIFENESIN 100 MG/5ML PO SOLN
5.0000 mL | Freq: Once | ORAL | Status: AC
  Administered 2016-02-19: 100 mg via ORAL
  Filled 2016-02-19: qty 5

## 2016-02-19 MED ORDER — METHYLPREDNISOLONE SODIUM SUCC 125 MG IJ SOLR
125.0000 mg | Freq: Once | INTRAMUSCULAR | Status: AC
  Administered 2016-02-19: 125 mg via INTRAVENOUS
  Filled 2016-02-19: qty 2

## 2016-02-19 MED ORDER — DOXYCYCLINE HYCLATE 100 MG PO CAPS: 100.0000 mg | ORAL_CAPSULE | Freq: Two times a day (BID) | ORAL | 0 refills | Status: DC

## 2016-02-19 MED ORDER — SODIUM CHLORIDE 0.9 % IV BOLUS (SEPSIS)
500.0000 mL | Freq: Once | INTRAVENOUS | Status: AC
  Administered 2016-02-19: 500 mL via INTRAVENOUS

## 2016-02-19 MED ORDER — IPRATROPIUM-ALBUTEROL 0.5-2.5 (3) MG/3ML IN SOLN
3.0000 mL | Freq: Once | RESPIRATORY_TRACT | Status: AC
  Administered 2016-02-19: 3 mL via RESPIRATORY_TRACT
  Filled 2016-02-19: qty 3

## 2016-02-19 MED ORDER — LORATADINE 10 MG PO TABS
10.0000 mg | ORAL_TABLET | Freq: Once | ORAL | Status: AC
  Administered 2016-02-19: 10 mg via ORAL
  Filled 2016-02-19: qty 1
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
## 2016-02-19 NOTE — ED Triage Notes (Signed)
Pt c/o chronic cough x4 days with lots of thick clear mucus, feels like he has some stuck in his throat and wont come up. Pt c/o wheezing and SOB. Pt speaking in complete sentences

## 2016-08-02 ENCOUNTER — Encounter (HOSPITAL_BASED_OUTPATIENT_CLINIC_OR_DEPARTMENT_OTHER): Payer: Self-pay | Admitting: *Deleted

## 2016-08-02 ENCOUNTER — Emergency Department (HOSPITAL_BASED_OUTPATIENT_CLINIC_OR_DEPARTMENT_OTHER)
Admission: EM | Admit: 2016-08-02 | Discharge: 2016-08-02 | Disposition: A | Discharge status: Home / Self Care | Payer: Medicare HMO | Attending: Emergency Medicine | Admitting: Emergency Medicine

## 2016-08-02 ENCOUNTER — Emergency Department (HOSPITAL_BASED_OUTPATIENT_CLINIC_OR_DEPARTMENT_OTHER): Payer: Medicare HMO

## 2016-08-02 DIAGNOSIS — J45909 Unspecified asthma, uncomplicated: Secondary | ICD-10-CM | POA: Insufficient documentation

## 2016-08-02 DIAGNOSIS — J449 Chronic obstructive pulmonary disease, unspecified: Secondary | ICD-10-CM

## 2016-08-02 DIAGNOSIS — I10 Essential (primary) hypertension: Secondary | ICD-10-CM | POA: Insufficient documentation

## 2016-08-02 DIAGNOSIS — R0602 Shortness of breath: Secondary | ICD-10-CM | POA: Diagnosis present

## 2016-08-02 LAB — COMPREHENSIVE METABOLIC PANEL
ALBUMIN: 3.8 g/dL (ref 3.5–5.0)
ALT: 30 U/L (ref 17–63)
AST: 35 U/L (ref 15–41)
Alkaline Phosphatase: 48 U/L (ref 38–126)
Anion gap: 7 (ref 5–15)
BUN: 16 mg/dL (ref 6–20)
CHLORIDE: 106 mmol/L (ref 101–111)
CO2: 27 mmol/L (ref 22–32)
Calcium: 9.2 mg/dL (ref 8.9–10.3)
Creatinine, Ser: 1.23 mg/dL (ref 0.61–1.24)
GFR calc Af Amer: >60 mL/min (ref >60)
GLUCOSE: 112 mg/dL — AB (ref 65–99)
POTASSIUM: 4 mmol/L (ref 3.5–5.1)
Sodium: 140 mmol/L (ref 135–145)
Total Bilirubin: 0.8 mg/dL (ref 0.3–1.2)
Total Protein: 7.2 g/dL (ref 6.5–8.1)

## 2016-08-02 LAB — CBC WITH DIFFERENTIAL/PLATELET
BASOS ABS: 0.1 10*3/uL (ref 0.0–0.1)
BASOS PCT: 1 %
EOS PCT: 9 %
Eosinophils Absolute: 0.9 10*3/uL — ABNORMAL HIGH (ref 0.0–0.7)
HCT: 38.2 % — ABNORMAL LOW (ref 39.0–52.0)
Hemoglobin: 12.9 g/dL — ABNORMAL LOW (ref 13.0–17.0)
Lymphocytes Relative: 35 %
Lymphs Abs: 3.7 10*3/uL (ref 0.7–4.0)
MCH: 28.9 pg (ref 26.0–34.0)
MCHC: 33.8 g/dL (ref 30.0–36.0)
MCV: 85.5 fL (ref 78.0–100.0)
MONO ABS: 1 10*3/uL (ref 0.1–1.0)
Monocytes Relative: 9 %
Neutro Abs: 4.9 10*3/uL (ref 1.7–7.7)
Neutrophils Relative %: 46 %
PLATELETS: 277 10*3/uL (ref 150–400)
RBC: 4.47 MIL/uL (ref 4.22–5.81)
RDW: 14.3 % (ref 11.5–15.5)
WBC: 10.6 10*3/uL — ABNORMAL HIGH (ref 4.0–10.5)

## 2016-08-02 MED ORDER — ALBUTEROL SULFATE (2.5 MG/3ML) 0.083% IN NEBU: 2.5000 mg | INHALATION_SOLUTION | RESPIRATORY_TRACT | 0 refills | Status: DC | PRN

## 2016-08-02 MED ORDER — PREDNISONE 50 MG PO TABS
60.0000 mg | ORAL_TABLET | Freq: Once | ORAL | Status: AC
  Administered 2016-08-02: 60 mg via ORAL
  Filled 2016-08-02: qty 1

## 2016-08-02 MED ORDER — IPRATROPIUM-ALBUTEROL 0.5-2.5 (3) MG/3ML IN SOLN
3.0000 mL | Freq: Once | RESPIRATORY_TRACT | Status: AC
  Administered 2016-08-02: 3 mL via RESPIRATORY_TRACT
  Filled 2016-08-02: qty 3

## 2016-08-02 MED ORDER — ALBUTEROL SULFATE (2.5 MG/3ML) 0.083% IN NEBU
2.5000 mg | INHALATION_SOLUTION | Freq: Once | RESPIRATORY_TRACT | Status: AC
  Administered 2016-08-02: 2.5 mg via RESPIRATORY_TRACT
  Filled 2016-08-02: qty 3

## 2016-08-02 MED ORDER — DOXYCYCLINE HYCLATE 100 MG PO TABS
100.0000 mg | ORAL_TABLET | Freq: Once | ORAL | Status: AC
  Administered 2016-08-02: 100 mg via ORAL
  Filled 2016-08-02: qty 1

## 2016-08-02 MED ORDER — PREDNISONE 20 MG PO TABS: ORAL_TABLET | ORAL | 0 refills | Status: DC

## 2016-08-02 MED ORDER — DOXYCYCLINE HYCLATE 100 MG PO CAPS: 100.0000 mg | ORAL_CAPSULE | Freq: Two times a day (BID) | ORAL | 0 refills | Status: DC

## 2016-08-02 MED ORDER — ALBUTEROL SULFATE HFA 108 (90 BASE) MCG/ACT IN AERS
2.0000 | INHALATION_SPRAY | RESPIRATORY_TRACT | Status: DC | PRN
  Administered 2016-08-02: 2 via RESPIRATORY_TRACT
  Filled 2016-08-02: qty 6.7

## 2016-08-02 MED ORDER — AEROCHAMBER PLUS W/MASK MISC
1.0000 | Freq: Once | Status: AC
  Administered 2016-08-02: 1
  Filled 2016-08-02: qty 1

## 2016-08-02 NOTE — ED Notes (Signed)
ED Provider at bedside. 

## 2016-08-02 NOTE — ED Triage Notes (Signed)
Pt with chronic cough and wheezing x 3 years. This episode began one month ago

## 2016-08-02 NOTE — ED Notes (Signed)
Ambulated in hallway, r/a. HR 100, SpO2 98-100%, RR 22-26, denies DOE.
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
## 2016-08-02 NOTE — Discharge Instructions (Signed)
Take the antibiotic doxycycline as prescribed. Use either your albuterol inhaler or albuterol nebulizer every 4 hours. Return if needed more than every 4 hours or see your primary care physician or pulmonologist(lung doctor)

## 2016-08-25 ENCOUNTER — Emergency Department (HOSPITAL_BASED_OUTPATIENT_CLINIC_OR_DEPARTMENT_OTHER): Payer: Medicare Other

## 2016-08-25 ENCOUNTER — Encounter (HOSPITAL_BASED_OUTPATIENT_CLINIC_OR_DEPARTMENT_OTHER): Payer: Self-pay | Admitting: *Deleted

## 2016-08-25 ENCOUNTER — Inpatient Hospital Stay (HOSPITAL_BASED_OUTPATIENT_CLINIC_OR_DEPARTMENT_OTHER)
Admission: EM | Admit: 2016-08-25 | Discharge: 2016-08-26 | DRG: 191 | Disposition: A | Discharge status: Home / Self Care | Payer: Medicare Other | Attending: Internal Medicine | Admitting: Internal Medicine

## 2016-08-25 DIAGNOSIS — Z79899 Other long term (current) drug therapy: Secondary | ICD-10-CM

## 2016-08-25 DIAGNOSIS — D638 Anemia in other chronic diseases classified elsewhere: Secondary | ICD-10-CM

## 2016-08-25 DIAGNOSIS — Z930 Tracheostomy status: Secondary | ICD-10-CM

## 2016-08-25 DIAGNOSIS — E669 Obesity, unspecified: Secondary | ICD-10-CM | POA: Diagnosis not present

## 2016-08-25 DIAGNOSIS — K5090 Crohn's disease, unspecified, without complications: Secondary | ICD-10-CM | POA: Diagnosis present

## 2016-08-25 DIAGNOSIS — R7989 Other specified abnormal findings of blood chemistry: Secondary | ICD-10-CM

## 2016-08-25 DIAGNOSIS — J45901 Unspecified asthma with (acute) exacerbation: Secondary | ICD-10-CM

## 2016-08-25 DIAGNOSIS — J441 Chronic obstructive pulmonary disease with (acute) exacerbation: Principal | ICD-10-CM | POA: Diagnosis present

## 2016-08-25 DIAGNOSIS — Z6829 Body mass index (BMI) 29.0-29.9, adult: Secondary | ICD-10-CM | POA: Diagnosis not present

## 2016-08-25 DIAGNOSIS — I10 Essential (primary) hypertension: Secondary | ICD-10-CM | POA: Diagnosis present

## 2016-08-25 DIAGNOSIS — Z9989 Dependence on other enabling machines and devices: Secondary | ICD-10-CM

## 2016-08-25 DIAGNOSIS — G4733 Obstructive sleep apnea (adult) (pediatric): Secondary | ICD-10-CM | POA: Diagnosis present

## 2016-08-25 DIAGNOSIS — R0602 Shortness of breath: Secondary | ICD-10-CM | POA: Diagnosis not present

## 2016-08-25 DIAGNOSIS — R778 Other specified abnormalities of plasma proteins: Secondary | ICD-10-CM

## 2016-08-25 LAB — BASIC METABOLIC PANEL
Anion gap: 8 (ref 5–15)
BUN: 12 mg/dL (ref 6–20)
CHLORIDE: 105 mmol/L (ref 101–111)
CO2: 25 mmol/L (ref 22–32)
Calcium: 9.1 mg/dL (ref 8.9–10.3)
Creatinine, Ser: 1.23 mg/dL (ref 0.61–1.24)
GFR calc non Af Amer: >60 mL/min (ref >60)
Glucose, Bld: 103 mg/dL — ABNORMAL HIGH (ref 65–99)
POTASSIUM: 3.7 mmol/L (ref 3.5–5.1)
SODIUM: 138 mmol/L (ref 135–145)

## 2016-08-25 LAB — CBC WITH DIFFERENTIAL/PLATELET
BASOS ABS: 0.1 10*3/uL (ref 0.0–0.1)
BASOS PCT: 1 %
Eosinophils Absolute: 1 10*3/uL — ABNORMAL HIGH (ref 0.0–0.7)
Eosinophils Relative: 9 %
HEMATOCRIT: 36.5 % — AB (ref 39.0–52.0)
HEMOGLOBIN: 12.1 g/dL — AB (ref 13.0–17.0)
Lymphocytes Relative: 25 %
Lymphs Abs: 2.8 10*3/uL (ref 0.7–4.0)
MCH: 28.7 pg (ref 26.0–34.0)
MCHC: 33.2 g/dL (ref 30.0–36.0)
MCV: 86.7 fL (ref 78.0–100.0)
Monocytes Absolute: 1.2 10*3/uL — ABNORMAL HIGH (ref 0.1–1.0)
Monocytes Relative: 11 %
NEUTROS ABS: 6.2 10*3/uL (ref 1.7–7.7)
NEUTROS PCT: 54 %
Platelets: 270 10*3/uL (ref 150–400)
RBC: 4.21 MIL/uL — AB (ref 4.22–5.81)
RDW: 14.5 % (ref 11.5–15.5)
WBC: 11.3 10*3/uL — ABNORMAL HIGH (ref 4.0–10.5)

## 2016-08-25 LAB — D-DIMER, QUANTITATIVE (NOT AT ARMC): D DIMER QUANT: 0.36 ug{FEU}/mL (ref 0.00–0.50)

## 2016-08-25 LAB — TROPONIN I: TROPONIN I: 0.05 ng/mL — AB (ref <0.03)

## 2016-08-25 LAB — BRAIN NATRIURETIC PEPTIDE: B Natriuretic Peptide: 6.5 pg/mL (ref 0.0–100.0)

## 2016-08-25 LAB — MAGNESIUM: Magnesium: 1.5 mg/dL — ABNORMAL LOW (ref 1.7–2.4)

## 2016-08-25 LAB — I-STAT CG4 LACTIC ACID, ED: Lactic Acid, Venous: 1.51 mmol/L (ref 0.5–1.9)

## 2016-08-25 MED ORDER — ALBUTEROL SULFATE (2.5 MG/3ML) 0.083% IN NEBU
2.5000 mg | INHALATION_SOLUTION | Freq: Once | RESPIRATORY_TRACT | Status: AC
  Administered 2016-08-25: 2.5 mg via RESPIRATORY_TRACT
  Filled 2016-08-25: qty 3

## 2016-08-25 MED ORDER — MAGNESIUM SULFATE 2 GM/50ML IV SOLN
2.0000 g | Freq: Once | INTRAVENOUS | Status: AC
  Administered 2016-08-25: 2 g via INTRAVENOUS
  Filled 2016-08-25: qty 50

## 2016-08-25 MED ORDER — IPRATROPIUM-ALBUTEROL 0.5-2.5 (3) MG/3ML IN SOLN
3.0000 mL | Freq: Once | RESPIRATORY_TRACT | Status: AC
  Administered 2016-08-25: 3 mL via RESPIRATORY_TRACT
  Filled 2016-08-25: qty 3

## 2016-08-25 MED ORDER — METHYLPREDNISOLONE SODIUM SUCC 125 MG IJ SOLR
125.0000 mg | Freq: Once | INTRAMUSCULAR | Status: AC
  Administered 2016-08-25: 125 mg via INTRAVENOUS
  Filled 2016-08-25: qty 2

## 2016-08-25 MED ORDER — IPRATROPIUM-ALBUTEROL 0.5-2.5 (3) MG/3ML IN SOLN
RESPIRATORY_TRACT | Status: AC
  Administered 2016-08-25: 3 mL via RESPIRATORY_TRACT
  Filled 2016-08-25: qty 3

## 2016-08-25 NOTE — Progress Notes (Signed)
Patient no longer labored and is resting comfortably on BIPAP.

## 2016-08-25 NOTE — ED Notes (Signed)
Brett Canales, RT placed patient on BiPAP

## 2016-08-25 NOTE — ED Triage Notes (Signed)
Wheezing, cough and chest pain. Hx of asthma. Productive cough. He has been using neb at home with no relief.
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
## 2016-08-26 ENCOUNTER — Encounter (HOSPITAL_COMMUNITY): Payer: Self-pay | Admitting: Oncology

## 2016-08-26 DIAGNOSIS — I10 Essential (primary) hypertension: Secondary | ICD-10-CM | POA: Diagnosis not present

## 2016-08-26 DIAGNOSIS — D638 Anemia in other chronic diseases classified elsewhere: Secondary | ICD-10-CM | POA: Diagnosis not present

## 2016-08-26 DIAGNOSIS — R748 Abnormal levels of other serum enzymes: Secondary | ICD-10-CM | POA: Diagnosis not present

## 2016-08-26 DIAGNOSIS — J45901 Unspecified asthma with (acute) exacerbation: Secondary | ICD-10-CM

## 2016-08-26 DIAGNOSIS — R778 Other specified abnormalities of plasma proteins: Secondary | ICD-10-CM

## 2016-08-26 DIAGNOSIS — R7989 Other specified abnormal findings of blood chemistry: Secondary | ICD-10-CM

## 2016-08-26 DIAGNOSIS — J441 Chronic obstructive pulmonary disease with (acute) exacerbation: Secondary | ICD-10-CM | POA: Diagnosis present

## 2016-08-26 LAB — TROPONIN I: TROPONIN I: 0.05 ng/mL — AB (ref <0.03)

## 2016-08-26 LAB — CREATININE, SERUM
Creatinine, Ser: 1.19 mg/dL (ref 0.61–1.24)
GFR calc Af Amer: >60 mL/min (ref >60)

## 2016-08-26 LAB — CBC
HEMATOCRIT: 39.2 % (ref 39.0–52.0)
Hemoglobin: 13.2 g/dL (ref 13.0–17.0)
MCH: 28.8 pg (ref 26.0–34.0)
MCHC: 33.7 g/dL (ref 30.0–36.0)
MCV: 85.4 fL (ref 78.0–100.0)
Platelets: 287 10*3/uL (ref 150–400)
RBC: 4.59 MIL/uL (ref 4.22–5.81)
RDW: 14.5 % (ref 11.5–15.5)
WBC: 14.1 10*3/uL — ABNORMAL HIGH (ref 4.0–10.5)

## 2016-08-26 MED ORDER — FEXOFENADINE HCL 180 MG PO TABS: 180.0000 mg | ORAL_TABLET | Freq: Every day | ORAL | 0 refills | Status: DC

## 2016-08-26 MED ORDER — ENOXAPARIN SODIUM 40 MG/0.4ML ~~LOC~~ SOLN
40.0000 mg | SUBCUTANEOUS | Status: DC
  Administered 2016-08-26: 40 mg via SUBCUTANEOUS
  Filled 2016-08-26: qty 0.4

## 2016-08-26 MED ORDER — IPRATROPIUM-ALBUTEROL 0.5-2.5 (3) MG/3ML IN SOLN
3.0000 mL | Freq: Four times a day (QID) | RESPIRATORY_TRACT | Status: DC
  Administered 2016-08-26 (×2): 3 mL via RESPIRATORY_TRACT
  Filled 2016-08-26: qty 3

## 2016-08-26 MED ORDER — IPRATROPIUM-ALBUTEROL 0.5-2.5 (3) MG/3ML IN SOLN: 3.0000 mL | Freq: Four times a day (QID) | RESPIRATORY_TRACT | 0 refills | Status: AC | PRN

## 2016-08-26 MED ORDER — ACETAMINOPHEN 325 MG PO TABS: 650.0000 mg | ORAL_TABLET | Freq: Four times a day (QID) | ORAL | Status: DC | PRN

## 2016-08-26 MED ORDER — IPRATROPIUM-ALBUTEROL 20-100 MCG/ACT IN AERS: 1.0000 | INHALATION_SPRAY | Freq: Four times a day (QID) | RESPIRATORY_TRACT | 0 refills | Status: AC

## 2016-08-26 MED ORDER — ACETAMINOPHEN 650 MG RE SUPP: 650.0000 mg | Freq: Four times a day (QID) | RECTAL | Status: DC | PRN

## 2016-08-26 MED ORDER — FLUTICASONE-SALMETEROL 250-50 MCG/DOSE IN AEPB: 1.0000 | INHALATION_SPRAY | Freq: Two times a day (BID) | RESPIRATORY_TRACT | 0 refills | Status: DC

## 2016-08-26 MED ORDER — PREDNISONE 10 MG PO TABS: 60.0000 mg | ORAL_TABLET | Freq: Every day | ORAL | 0 refills | Status: DC

## 2016-08-26 MED ORDER — DM-GUAIFENESIN ER 30-600 MG PO TB12: 1.0000 | ORAL_TABLET | Freq: Two times a day (BID) | ORAL | 0 refills | Status: DC

## 2016-08-26 MED ORDER — METHYLPREDNISOLONE SODIUM SUCC 40 MG IJ SOLR
40.0000 mg | Freq: Two times a day (BID) | INTRAMUSCULAR | Status: DC
  Administered 2016-08-26: 40 mg via INTRAVENOUS
  Filled 2016-08-26: qty 1

## 2016-08-26 MED ORDER — ONDANSETRON HCL 4 MG/2ML IJ SOLN: 4.0000 mg | Freq: Four times a day (QID) | INTRAMUSCULAR | Status: DC | PRN

## 2016-08-26 MED ORDER — MONTELUKAST SODIUM 10 MG PO TABS: 10.0000 mg | ORAL_TABLET | Freq: Every day | ORAL | 0 refills | Status: DC

## 2016-08-26 MED ORDER — ALBUTEROL SULFATE (2.5 MG/3ML) 0.083% IN NEBU: 2.5000 mg | INHALATION_SOLUTION | RESPIRATORY_TRACT | Status: DC | PRN

## 2016-08-26 MED ORDER — MAGNESIUM SULFATE 2 GM/50ML IV SOLN: 2.0000 g | Freq: Once | INTRAVENOUS | Status: DC

## 2016-08-26 MED ORDER — ALBUTEROL (5 MG/ML) CONTINUOUS INHALATION SOLN
10.0000 mg/h | INHALATION_SOLUTION | RESPIRATORY_TRACT | Status: AC
  Administered 2016-08-26: 10 mg/h via RESPIRATORY_TRACT
  Filled 2016-08-26: qty 20

## 2016-08-26 MED ORDER — ONDANSETRON HCL 4 MG PO TABS: 4.0000 mg | ORAL_TABLET | Freq: Four times a day (QID) | ORAL | Status: DC | PRN

## 2016-08-26 MED ORDER — DM-GUAIFENESIN ER 30-600 MG PO TB12
1.0000 | ORAL_TABLET | Freq: Two times a day (BID) | ORAL | Status: DC
  Administered 2016-08-26: 1 via ORAL
  Filled 2016-08-26: qty 1

## 2016-08-26 NOTE — ED Notes (Signed)
Report given to [facility] via telephone 

## 2016-08-26 NOTE — Progress Notes (Signed)
Pt discharged to home, instructions reviewed acknowledge understanding. SRP, RN

## 2016-08-26 NOTE — ED Notes (Signed)
Water given, ok by MD

## 2016-08-26 NOTE — Discharge Instructions (Signed)
Your Albuterol inhaler and Nebs are being replaced with Combivent and Duonebs. If you cannot afford them, go back to using Albuterol.   You should see your pulmonologist in 2-3 days.
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
## 2016-08-27 LAB — HIV ANTIBODY (ROUTINE TESTING W REFLEX): HIV Screen 4th Generation wRfx: NONREACTIVE

## 2016-11-04 ENCOUNTER — Emergency Department (HOSPITAL_BASED_OUTPATIENT_CLINIC_OR_DEPARTMENT_OTHER)
Admission: EM | Admit: 2016-11-04 | Discharge: 2016-11-04 | Disposition: A | Discharge status: Home / Self Care | Payer: Medicare HMO | Attending: Emergency Medicine | Admitting: Emergency Medicine

## 2016-11-04 ENCOUNTER — Emergency Department (HOSPITAL_BASED_OUTPATIENT_CLINIC_OR_DEPARTMENT_OTHER): Payer: Medicare HMO

## 2016-11-04 ENCOUNTER — Encounter (HOSPITAL_BASED_OUTPATIENT_CLINIC_OR_DEPARTMENT_OTHER): Payer: Self-pay | Admitting: Emergency Medicine

## 2016-11-04 DIAGNOSIS — Z79899 Other long term (current) drug therapy: Secondary | ICD-10-CM | POA: Diagnosis not present

## 2016-11-04 DIAGNOSIS — J45909 Unspecified asthma, uncomplicated: Secondary | ICD-10-CM | POA: Diagnosis not present

## 2016-11-04 DIAGNOSIS — I10 Essential (primary) hypertension: Secondary | ICD-10-CM | POA: Diagnosis not present

## 2016-11-04 DIAGNOSIS — J441 Chronic obstructive pulmonary disease with (acute) exacerbation: Secondary | ICD-10-CM

## 2016-11-04 DIAGNOSIS — R0602 Shortness of breath: Secondary | ICD-10-CM | POA: Diagnosis present

## 2016-11-04 MED ORDER — ALBUTEROL SULFATE (2.5 MG/3ML) 0.083% IN NEBU
5.0000 mg | INHALATION_SOLUTION | Freq: Once | RESPIRATORY_TRACT | Status: AC
  Administered 2016-11-04: 5 mg via RESPIRATORY_TRACT
  Filled 2016-11-04: qty 6

## 2016-11-04 MED ORDER — IPRATROPIUM BROMIDE 0.02 % IN SOLN
0.5000 mg | Freq: Once | RESPIRATORY_TRACT | Status: AC
  Administered 2016-11-04: 0.5 mg via RESPIRATORY_TRACT
  Filled 2016-11-04: qty 2.5

## 2016-11-04 MED ORDER — PREDNISONE 20 MG PO TABS: 40.0000 mg | ORAL_TABLET | Freq: Every day | ORAL | 0 refills | Status: DC

## 2016-11-04 MED ORDER — DOXYCYCLINE HYCLATE 100 MG PO CAPS: 100.0000 mg | ORAL_CAPSULE | Freq: Two times a day (BID) | ORAL | 0 refills | Status: DC

## 2016-11-04 MED FILL — DOXYCYCLINE HYCLATE 100 MG: 100 | 7 days supply | Qty: 14 | Fill #0

## 2016-11-04 MED FILL — predniSONE 20 MG TABS: 20 | 5 days supply | Qty: 10 | Fill #0

## 2016-11-04 NOTE — ED Triage Notes (Signed)
C/o SOB since 0100. Tried inhaler this morning without much relief. Also reports painful knot to L neck.

## 2016-11-04 NOTE — ED Notes (Signed)
Pt directed to pharmacy to pick up Rx. F/u care discussed
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
## 2016-11-04 NOTE — ED Notes (Signed)
ED Provider at bedside. 

## 2016-11-05 ENCOUNTER — Emergency Department (HOSPITAL_BASED_OUTPATIENT_CLINIC_OR_DEPARTMENT_OTHER): Payer: Medicare HMO

## 2016-11-05 ENCOUNTER — Encounter (HOSPITAL_BASED_OUTPATIENT_CLINIC_OR_DEPARTMENT_OTHER): Payer: Self-pay | Admitting: *Deleted

## 2016-11-05 ENCOUNTER — Emergency Department (HOSPITAL_BASED_OUTPATIENT_CLINIC_OR_DEPARTMENT_OTHER)
Admission: EM | Admit: 2016-11-05 | Discharge: 2016-11-05 | Disposition: A | Discharge status: Home / Self Care | Payer: Medicare HMO | Attending: Emergency Medicine | Admitting: Emergency Medicine

## 2016-11-05 DIAGNOSIS — I10 Essential (primary) hypertension: Secondary | ICD-10-CM | POA: Diagnosis not present

## 2016-11-05 DIAGNOSIS — R197 Diarrhea, unspecified: Secondary | ICD-10-CM | POA: Diagnosis not present

## 2016-11-05 DIAGNOSIS — J45909 Unspecified asthma, uncomplicated: Secondary | ICD-10-CM | POA: Diagnosis not present

## 2016-11-05 DIAGNOSIS — Z79899 Other long term (current) drug therapy: Secondary | ICD-10-CM | POA: Insufficient documentation

## 2016-11-05 DIAGNOSIS — J441 Chronic obstructive pulmonary disease with (acute) exacerbation: Secondary | ICD-10-CM | POA: Insufficient documentation

## 2016-11-05 DIAGNOSIS — R0602 Shortness of breath: Secondary | ICD-10-CM | POA: Diagnosis present

## 2016-11-05 HISTORY — DX: Chronic obstructive pulmonary disease, unspecified: J44.9

## 2016-11-05 LAB — BASIC METABOLIC PANEL
ANION GAP: 8 (ref 5–15)
BUN: 11 mg/dL (ref 6–20)
CO2: 24 mmol/L (ref 22–32)
Calcium: 8.8 mg/dL — ABNORMAL LOW (ref 8.9–10.3)
Chloride: 104 mmol/L (ref 101–111)
Creatinine, Ser: 1.09 mg/dL (ref 0.61–1.24)
GLUCOSE: 141 mg/dL — AB (ref 65–99)
POTASSIUM: 3.9 mmol/L (ref 3.5–5.1)
Sodium: 136 mmol/L (ref 135–145)

## 2016-11-05 MED ORDER — DICYCLOMINE HCL 10 MG/ML IM SOLN
20.0000 mg | Freq: Once | INTRAMUSCULAR | Status: AC
  Administered 2016-11-05: 20 mg via INTRAMUSCULAR
  Filled 2016-11-05: qty 2

## 2016-11-05 MED ORDER — IPRATROPIUM-ALBUTEROL 0.5-2.5 (3) MG/3ML IN SOLN
3.0000 mL | Freq: Once | RESPIRATORY_TRACT | Status: AC
  Administered 2016-11-05: 3 mL via RESPIRATORY_TRACT
  Filled 2016-11-05: qty 3

## 2016-11-05 MED ORDER — ALBUTEROL SULFATE HFA 108 (90 BASE) MCG/ACT IN AERS
2.0000 | INHALATION_SPRAY | RESPIRATORY_TRACT | Status: DC | PRN
Start: 2016-11-05 — End: 2016-11-05
  Administered 2016-11-05: 2 via RESPIRATORY_TRACT

## 2016-11-05 MED ORDER — BENZONATATE 100 MG PO CAPS: 100.0000 mg | ORAL_CAPSULE | Freq: Three times a day (TID) | ORAL | 0 refills | Status: DC

## 2016-11-05 MED ORDER — ALUM & MAG HYDROXIDE-SIMETH 200-200-20 MG/5ML PO SUSP
15.0000 mL | Freq: Once | ORAL | Status: AC
  Administered 2016-11-05: 15 mL via ORAL
  Filled 2016-11-05: qty 30

## 2016-11-05 MED ORDER — ALBUTEROL SULFATE (2.5 MG/3ML) 0.083% IN NEBU
2.5000 mg | INHALATION_SOLUTION | RESPIRATORY_TRACT | Status: AC
  Administered 2016-11-05: 2.5 mg via RESPIRATORY_TRACT
  Filled 2016-11-05: qty 3

## 2016-11-05 MED ORDER — DEXAMETHASONE SODIUM PHOSPHATE 10 MG/ML IJ SOLN
10.0000 mg | Freq: Once | INTRAMUSCULAR | Status: AC
  Administered 2016-11-05: 10 mg via INTRAMUSCULAR
  Filled 2016-11-05: qty 1

## 2016-11-05 MED ORDER — ALBUTEROL SULFATE HFA 108 (90 BASE) MCG/ACT IN AERS
INHALATION_SPRAY | RESPIRATORY_TRACT | Status: AC
  Administered 2016-11-05: 2 via RESPIRATORY_TRACT
  Filled 2016-11-05: qty 6.7

## 2016-11-05 MED ORDER — SODIUM CHLORIDE 0.9 % IV BOLUS (SEPSIS)
500.0000 mL | Freq: Once | INTRAVENOUS | Status: AC
  Administered 2016-11-05: 500 mL via INTRAVENOUS

## 2016-11-05 MED ORDER — FLUTICASONE PROPIONATE HFA 110 MCG/ACT IN AERO: 2.0000 | INHALATION_SPRAY | Freq: Two times a day (BID) | RESPIRATORY_TRACT | 0 refills | Status: DC

## 2016-11-05 MED ORDER — LORATADINE 10 MG PO TABS
10.0000 mg | ORAL_TABLET | Freq: Once | ORAL | Status: AC
  Administered 2016-11-05: 10 mg via ORAL
  Filled 2016-11-05: qty 1

## 2016-11-05 NOTE — ED Triage Notes (Addendum)
Pt states meds that he is on is causing increased urination, diarrhea, also complains of being short of breath  Talking complete sentences

## 2016-11-05 NOTE — ED Notes (Signed)
Pt states he is feeling better.  
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
## 2018-03-13 ENCOUNTER — Encounter (HOSPITAL_BASED_OUTPATIENT_CLINIC_OR_DEPARTMENT_OTHER): Payer: Self-pay | Admitting: *Deleted

## 2018-03-13 ENCOUNTER — Emergency Department (HOSPITAL_BASED_OUTPATIENT_CLINIC_OR_DEPARTMENT_OTHER): Payer: Medicare HMO

## 2018-03-13 ENCOUNTER — Other Ambulatory Visit: Payer: Self-pay

## 2018-03-13 ENCOUNTER — Emergency Department (HOSPITAL_BASED_OUTPATIENT_CLINIC_OR_DEPARTMENT_OTHER)
Admission: EM | Admit: 2018-03-13 | Discharge: 2018-03-13 | Disposition: A | Discharge status: Home / Self Care | Payer: Medicare HMO | Attending: Emergency Medicine | Admitting: Emergency Medicine

## 2018-03-13 DIAGNOSIS — R05 Cough: Secondary | ICD-10-CM | POA: Diagnosis present

## 2018-03-13 DIAGNOSIS — Z79899 Other long term (current) drug therapy: Secondary | ICD-10-CM | POA: Insufficient documentation

## 2018-03-13 DIAGNOSIS — J449 Chronic obstructive pulmonary disease, unspecified: Secondary | ICD-10-CM | POA: Diagnosis not present

## 2018-03-13 DIAGNOSIS — J209 Acute bronchitis, unspecified: Secondary | ICD-10-CM | POA: Diagnosis not present

## 2018-03-13 DIAGNOSIS — I10 Essential (primary) hypertension: Secondary | ICD-10-CM | POA: Insufficient documentation

## 2018-03-13 MED ORDER — IPRATROPIUM-ALBUTEROL 0.5-2.5 (3) MG/3ML IN SOLN
3.0000 mL | Freq: Once | RESPIRATORY_TRACT | Status: AC
  Administered 2018-03-13: 3 mL via RESPIRATORY_TRACT
  Filled 2018-03-13: qty 3

## 2018-03-13 MED ORDER — DOXYCYCLINE HYCLATE 100 MG PO TABS
100.0000 mg | ORAL_TABLET | Freq: Once | ORAL | Status: AC
  Administered 2018-03-13: 100 mg via ORAL
  Filled 2018-03-13: qty 1

## 2018-03-13 MED ORDER — BLISTEX MEDICATED EX OINT
TOPICAL_OINTMENT | CUTANEOUS | Status: AC
  Administered 2018-03-13: 02:00:00
  Filled 2018-03-13: qty 10

## 2018-03-13 MED ORDER — LIP MEDEX EX OINT: TOPICAL_OINTMENT | CUTANEOUS | Status: DC | PRN

## 2018-03-13 MED ORDER — DEXAMETHASONE SODIUM PHOSPHATE 10 MG/ML IJ SOLN
10.0000 mg | Freq: Once | INTRAMUSCULAR | Status: AC
  Administered 2018-03-13: 10 mg via INTRAMUSCULAR
  Filled 2018-03-13: qty 1

## 2018-03-13 MED ORDER — ALBUTEROL SULFATE (2.5 MG/3ML) 0.083% IN NEBU
2.5000 mg | INHALATION_SOLUTION | Freq: Once | RESPIRATORY_TRACT | Status: AC
  Administered 2018-03-13: 2.5 mg via RESPIRATORY_TRACT
  Filled 2018-03-13: qty 3

## 2018-03-13 MED ORDER — DOXYCYCLINE HYCLATE 100 MG PO CAPS: 100.0000 mg | ORAL_CAPSULE | Freq: Two times a day (BID) | ORAL | 0 refills | Status: DC

## 2018-03-13 MED ORDER — ALBUTEROL SULFATE HFA 108 (90 BASE) MCG/ACT IN AERS
2.0000 | INHALATION_SPRAY | RESPIRATORY_TRACT | Status: DC | PRN
  Administered 2018-03-13: 2 via RESPIRATORY_TRACT
  Filled 2018-03-13: qty 6.7

## 2018-03-13 MED ORDER — ALBUTEROL SULFATE (2.5 MG/3ML) 0.083% IN NEBU
2.5000 mg | INHALATION_SOLUTION | Freq: Once | RESPIRATORY_TRACT | Status: DC
  Filled 2018-03-13: qty 3

## 2018-03-13 NOTE — ED Triage Notes (Addendum)
C/o productive cough, congestion, x 4 days     Out of inhaler

## 2018-03-13 NOTE — ED Provider Notes (Signed)
[facility] Provider Note: Lowella Dell, MD, FACEP  CSN: [id number] MRN: [id number] ARRIVAL: 03/13/18 at 0058 ROOM: [facility]   CHIEF COMPLAINT  Cough   HISTORY OF PRESENT ILLNESS  03/13/18 1:24 AM Marvin Romero is a 67 y.o. male with history of COPD.  He is here with a 4-day history of productive cough with shortness of breath.  Symptoms are moderate and worse when lying supine.  They are not worse with exertion.  He denies a fever.  He has had difficulty sleeping because of the persistent cough.  He has not had a fever.  He is out of his albuterol inhaler.   Past Medical History:  Diagnosis Date  . Asthma   . COPD (chronic obstructive pulmonary disease) (HCC)   . Hypertension   . Seasonal allergies   . Sleep apnea     Past Surgical History:  Procedure Laterality Date  . colonostomy    . COLOSTOMY REVERSAL    . NECK SURGERY      No family history on file.  Social History   Tobacco Use  . Smoking status: Never Smoker  . Smokeless tobacco: Never Used  Substance Use Topics  . Alcohol use: No  . Drug use: No    Prior to Admission medications   Medication Sig Start Date End Date Taking? Authorizing Provider  acetaminophen (TYLENOL) 325 MG tablet Take 2 tablets (650 mg total) by mouth every 6 (six) hours as needed for mild pain (or Fever >/= 101). 08/26/16   Calvert Cantor, MD  amlodipine-atorvastatin (CADUET) 2.5-10 MG tablet Take 1 tablet by mouth daily with breakfast.     [provider]  benzonatate (TESSALON) 100 MG capsule Take 1 capsule (100 mg total) by mouth every 8 (eight) hours. 11/05/16   Palumbo, April, MD  Cyanocobalamin (VITAMIN B-12 PO) Take 1 tablet by mouth daily.    [provider]  dextromethorphan-guaiFENesin (MUCINEX DM) 30-600 MG 12hr tablet Take 1 tablet by mouth 2 (two) times daily. 08/26/16   Calvert Cantor, MD  doxycycline (VIBRAMYCIN) 100 MG capsule Take 1 capsule (100 mg total) by mouth 2 (two) times daily.  11/04/16   Benjiman Core, MD  fexofenadine (ALLEGRA) 180 MG tablet Take 1 tablet (180 mg total) by mouth daily. 08/26/16   Calvert Cantor, MD  fluticasone (FLOVENT HFA) 110 MCG/ACT inhaler Inhale 2 puffs into the lungs 2 (two) times daily. 11/05/16   Palumbo, April, MD  Fluticasone-Salmeterol (ADVAIR DISKUS) 250-50 MCG/DOSE AEPB Inhale 1 puff into the lungs 2 (two) times daily. 08/26/16   Calvert Cantor, MD  Ipratropium-Albuterol (COMBIVENT) 20-100 MCG/ACT AERS respimat Inhale 1 puff into the lungs every 6 (six) hours. 08/26/16   Calvert Cantor, MD  ipratropium-albuterol (DUONEB) 0.5-2.5 (3) MG/3ML SOLN Take 3 mLs by nebulization every 6 (six) hours as needed (dyspnea). 08/26/16   Calvert Cantor, MD  montelukast (SINGULAIR) 10 MG tablet Take 1 tablet (10 mg total) by mouth at bedtime. 08/26/16   Calvert Cantor, MD  Multiple Vitamin (MULTIVITAMIN WITH MINERALS) TABS tablet Take 1 tablet by mouth daily.    [provider]  predniSONE (DELTASONE) 20 MG tablet Take 2 tablets (40 mg total) by mouth daily. 11/04/16   Benjiman Core, MD    Allergies Patient has no known allergies.   REVIEW OF SYSTEMS  Negative except as noted here or in the History of Present Illness.   PHYSICAL EXAMINATION  Initial Vital Signs Blood pressure (!) 146/93, pulse 77, temperature 98.1 F (36.7 C), temperature  source Oral, resp. rate 20, height 6' (1.829 m), weight 93.9 kg, SpO2 100 %.  Examination General: Well-developed, well-nourished male in no acute distress; appearance consistent with age of record HENT: normocephalic; atraumatic; chapped lips Eyes: pupils equal, round and reactive to light; extraocular muscles intact Neck: supple Heart: regular rate and rhythm Lungs: Expiratory wheezing; frequent cough Abdomen: soft; nondistended; nontender; bowel sounds present Extremities: No deformity; full range of motion; pulses normal Neurologic: Awake, alert and oriented; motor function intact in all extremities  and symmetric; no facial droop Skin: Warm and dry Psychiatric: Normal mood and affect   RESULTS  Summary of this visit's results, reviewed by myself:   EKG Interpretation  Date/Time:    Ventricular Rate:    PR Interval:    QRS Duration:   QT Interval:    QTC Calculation:   R Axis:     Text Interpretation:        Laboratory Studies: No results found for this or any previous visit (from the past 24 hour(s)). Imaging Studies: Dg Chest 2 View  Result Date: 03/13/2018 CLINICAL DATA:  Acute onset of cough and shortness of breath. EXAM: CHEST - 2 VIEW COMPARISON:  Chest radiograph performed 11/05/2016 FINDINGS: The lungs are well-aerated and clear. There is no evidence of focal opacification, pleural effusion or pneumothorax. The heart is normal in size; the mediastinal contour is within normal limits. No acute osseous abnormalities are seen. Scattered metallic foreign bodies are noted overlying the upper chest and neck. IMPRESSION: No acute cardiopulmonary process seen. Electronically Signed   By: Roanna Raider M.D.   On: 03/13/2018 02:20    ED COURSE and MDM  Nursing notes and initial vitals signs, including pulse oximetry, reviewed.  Vitals:   03/13/18 0110 03/13/18 0112 03/13/18 0128 03/13/18 0218  BP: (!) 146/93     Pulse: 77     Resp: 20     Temp: 98.1 F (36.7 C)     TempSrc: Oral     SpO2: 100%  100% 100%  Weight:  93.9 kg    Height:  6' (1.829 m)     2:23 AM Lungs clear after DuoNeb treatment.  We will refill his inhaler and place him on a course of doxycycline.  PROCEDURES    ED DIAGNOSES     ICD-10-CM   1. Acute bronchitis with bronchospasm J20.9        Perri Aragones, Jonny Ruiz, MD 03/13/18 [phone]

## 2018-03-13 NOTE — Progress Notes (Signed)
Patient states that he is feeling much better after the first nebulizer treatment.

## 2018-04-24 ENCOUNTER — Other Ambulatory Visit: Payer: Self-pay

## 2018-04-24 ENCOUNTER — Emergency Department (HOSPITAL_BASED_OUTPATIENT_CLINIC_OR_DEPARTMENT_OTHER)
Admission: EM | Admit: 2018-04-24 | Discharge: 2018-04-24 | Disposition: A | Discharge status: Home / Self Care | Payer: Medicare HMO | Attending: Emergency Medicine | Admitting: Emergency Medicine

## 2018-04-24 ENCOUNTER — Emergency Department (HOSPITAL_BASED_OUTPATIENT_CLINIC_OR_DEPARTMENT_OTHER): Payer: Medicare HMO

## 2018-04-24 ENCOUNTER — Encounter (HOSPITAL_BASED_OUTPATIENT_CLINIC_OR_DEPARTMENT_OTHER): Payer: Self-pay | Admitting: *Deleted

## 2018-04-24 DIAGNOSIS — J40 Bronchitis, not specified as acute or chronic: Secondary | ICD-10-CM

## 2018-04-24 DIAGNOSIS — Z7982 Long term (current) use of aspirin: Secondary | ICD-10-CM | POA: Diagnosis not present

## 2018-04-24 DIAGNOSIS — Z79899 Other long term (current) drug therapy: Secondary | ICD-10-CM | POA: Insufficient documentation

## 2018-04-24 DIAGNOSIS — J189 Pneumonia, unspecified organism: Secondary | ICD-10-CM | POA: Diagnosis not present

## 2018-04-24 DIAGNOSIS — J209 Acute bronchitis, unspecified: Secondary | ICD-10-CM | POA: Insufficient documentation

## 2018-04-24 DIAGNOSIS — I10 Essential (primary) hypertension: Secondary | ICD-10-CM | POA: Diagnosis not present

## 2018-04-24 DIAGNOSIS — J181 Lobar pneumonia, unspecified organism: Secondary | ICD-10-CM

## 2018-04-24 DIAGNOSIS — R05 Cough: Secondary | ICD-10-CM | POA: Diagnosis present

## 2018-04-24 MED ORDER — FLUTICASONE-SALMETEROL 250-50 MCG/DOSE IN AEPB: 1.0000 | INHALATION_SPRAY | Freq: Two times a day (BID) | RESPIRATORY_TRACT | 0 refills | Status: AC

## 2018-04-24 MED ORDER — ALBUTEROL SULFATE HFA 108 (90 BASE) MCG/ACT IN AERS
1.0000 | INHALATION_SPRAY | Freq: Once | RESPIRATORY_TRACT | Status: AC
  Administered 2018-04-24: 2 via RESPIRATORY_TRACT
  Filled 2018-04-24: qty 6.7

## 2018-04-24 MED ORDER — DOXYCYCLINE HYCLATE 100 MG PO CAPS: 100.0000 mg | ORAL_CAPSULE | Freq: Two times a day (BID) | ORAL | 0 refills | Status: DC

## 2018-04-24 MED ORDER — IPRATROPIUM-ALBUTEROL 0.5-2.5 (3) MG/3ML IN SOLN
3.0000 mL | Freq: Four times a day (QID) | RESPIRATORY_TRACT | Status: DC
  Administered 2018-04-24: 3 mL via RESPIRATORY_TRACT
  Filled 2018-04-24 (×2): qty 3

## 2018-04-24 MED ORDER — DEXAMETHASONE SODIUM PHOSPHATE 10 MG/ML IJ SOLN
10.0000 mg | Freq: Once | INTRAMUSCULAR | Status: AC
  Administered 2018-04-24: 10 mg via INTRAMUSCULAR
  Filled 2018-04-24: qty 1

## 2018-04-24 MED ORDER — DOXYCYCLINE HYCLATE 100 MG PO TABS
100.0000 mg | ORAL_TABLET | Freq: Once | ORAL | Status: AC
  Administered 2018-04-24: 100 mg via ORAL
  Filled 2018-04-24: qty 1

## 2018-04-24 MED ORDER — IPRATROPIUM-ALBUTEROL 0.5-2.5 (3) MG/3ML IN SOLN
3.0000 mL | Freq: Four times a day (QID) | RESPIRATORY_TRACT | Status: DC
  Administered 2018-04-24: 3 mL via RESPIRATORY_TRACT

## 2018-04-24 MED ORDER — HYDROCODONE-ACETAMINOPHEN 5-325 MG PO TABS
1.0000 | ORAL_TABLET | Freq: Once | ORAL | Status: AC
Start: 2018-04-24 — End: 2018-04-24
  Administered 2018-04-24: 1 via ORAL
  Filled 2018-04-24: qty 1

## 2018-04-24 NOTE — ED Notes (Signed)
ED Provider at bedside. 

## 2018-04-24 NOTE — ED Notes (Signed)
Pt left at this time with all belongings.  

## 2018-04-24 NOTE — ED Notes (Signed)
Pt states he has a ride home
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
## 2018-04-24 NOTE — Discharge Instructions (Addendum)
Continue Doxycycline (antibiotic) for the next week Use inhaler as needed for shortness of breath and wheezing Please follow up with your doctor Return if worsening

## 2018-04-24 NOTE — ED Triage Notes (Signed)
Pt reports SOB, wheezing, cough, states friend has the flu, c/o pain in ribs and "all over"

## 2018-04-26 ENCOUNTER — Emergency Department (HOSPITAL_BASED_OUTPATIENT_CLINIC_OR_DEPARTMENT_OTHER)
Admission: EM | Admit: 2018-04-26 | Discharge: 2018-04-26 | Disposition: A | Discharge status: Home / Self Care | Payer: Medicare HMO | Attending: Emergency Medicine | Admitting: Emergency Medicine

## 2018-04-26 ENCOUNTER — Other Ambulatory Visit: Payer: Self-pay

## 2018-04-26 ENCOUNTER — Encounter (HOSPITAL_BASED_OUTPATIENT_CLINIC_OR_DEPARTMENT_OTHER): Payer: Self-pay | Admitting: *Deleted

## 2018-04-26 DIAGNOSIS — Z7982 Long term (current) use of aspirin: Secondary | ICD-10-CM | POA: Diagnosis not present

## 2018-04-26 DIAGNOSIS — J440 Chronic obstructive pulmonary disease with acute lower respiratory infection: Secondary | ICD-10-CM

## 2018-04-26 DIAGNOSIS — Z79899 Other long term (current) drug therapy: Secondary | ICD-10-CM | POA: Insufficient documentation

## 2018-04-26 DIAGNOSIS — I10 Essential (primary) hypertension: Secondary | ICD-10-CM | POA: Diagnosis not present

## 2018-04-26 DIAGNOSIS — J9801 Acute bronchospasm: Secondary | ICD-10-CM | POA: Diagnosis not present

## 2018-04-26 DIAGNOSIS — J441 Chronic obstructive pulmonary disease with (acute) exacerbation: Secondary | ICD-10-CM | POA: Insufficient documentation

## 2018-04-26 DIAGNOSIS — J209 Acute bronchitis, unspecified: Secondary | ICD-10-CM

## 2018-04-26 DIAGNOSIS — R05 Cough: Secondary | ICD-10-CM | POA: Diagnosis present

## 2018-04-26 MED ORDER — BENZONATATE 100 MG PO CAPS
200.0000 mg | ORAL_CAPSULE | Freq: Once | ORAL | Status: AC
  Administered 2018-04-26: 200 mg via ORAL
  Filled 2018-04-26: qty 2

## 2018-04-26 MED ORDER — GUAIFENESIN 100 MG/5ML PO LIQD
100.0000 mg | ORAL | 0 refills | Status: DC | PRN
Start: 2018-04-26 — End: 2020-08-03

## 2018-04-26 MED ORDER — GUAIFENESIN ER 600 MG PO TB12
600.0000 mg | ORAL_TABLET | Freq: Two times a day (BID) | ORAL | Status: DC
  Filled 2018-04-26: qty 1

## 2018-04-26 MED ORDER — IPRATROPIUM-ALBUTEROL 0.5-2.5 (3) MG/3ML IN SOLN
3.0000 mL | Freq: Once | RESPIRATORY_TRACT | Status: AC
  Administered 2018-04-26: 3 mL via RESPIRATORY_TRACT

## 2018-04-26 MED ORDER — ALBUTEROL SULFATE (2.5 MG/3ML) 0.083% IN NEBU
5.0000 mg | INHALATION_SOLUTION | Freq: Once | RESPIRATORY_TRACT | Status: AC
  Administered 2018-04-26: 5 mg via RESPIRATORY_TRACT

## 2018-04-26 MED ORDER — ALBUTEROL SULFATE (2.5 MG/3ML) 0.083% IN NEBU
2.5000 mg | INHALATION_SOLUTION | Freq: Once | RESPIRATORY_TRACT | Status: AC
  Administered 2018-04-26: 2.5 mg via RESPIRATORY_TRACT

## 2018-04-26 MED ORDER — SODIUM CHLORIDE 3 % IN NEBU: INHALATION_SOLUTION | RESPIRATORY_TRACT | 12 refills | Status: DC | PRN

## 2018-04-26 MED ORDER — PREDNISONE 10 MG PO TABS: 50.0000 mg | ORAL_TABLET | Freq: Every day | ORAL | 0 refills | Status: DC

## 2018-04-26 NOTE — Discharge Instructions (Addendum)
We saw in the ER for your cough. Please take the medications prescribed.  We are also adding prednisone to your regimen.  Return to the ER if your breathing gets worse.

## 2018-04-26 NOTE — ED Notes (Addendum)
Walked pt in the dept:  Starting  Pulse -111    SpO2 - 99 %  R/A  Finish     Pulse - 123   Spo2 - 92 %  R/A   Pt had a steady gate.

## 2018-04-26 NOTE — ED Triage Notes (Signed)
Pt has had a cough for over a week, he was seen here on Saturday, but says he needs cough medicine
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
## 2018-11-04 ENCOUNTER — Other Ambulatory Visit: Payer: Self-pay

## 2018-11-04 ENCOUNTER — Emergency Department (HOSPITAL_BASED_OUTPATIENT_CLINIC_OR_DEPARTMENT_OTHER)
Admission: EM | Admit: 2018-11-04 | Discharge: 2018-11-04 | Disposition: A | Discharge status: Home / Self Care | Payer: Medicare HMO | Attending: Emergency Medicine | Admitting: Emergency Medicine

## 2018-11-04 ENCOUNTER — Encounter (HOSPITAL_BASED_OUTPATIENT_CLINIC_OR_DEPARTMENT_OTHER): Payer: Self-pay

## 2018-11-04 DIAGNOSIS — Z79899 Other long term (current) drug therapy: Secondary | ICD-10-CM | POA: Insufficient documentation

## 2018-11-04 DIAGNOSIS — J40 Bronchitis, not specified as acute or chronic: Secondary | ICD-10-CM | POA: Insufficient documentation

## 2018-11-04 DIAGNOSIS — Z7982 Long term (current) use of aspirin: Secondary | ICD-10-CM | POA: Diagnosis not present

## 2018-11-04 DIAGNOSIS — J449 Chronic obstructive pulmonary disease, unspecified: Secondary | ICD-10-CM | POA: Diagnosis not present

## 2018-11-04 DIAGNOSIS — R05 Cough: Secondary | ICD-10-CM | POA: Diagnosis present

## 2018-11-04 MED ORDER — PREDNISONE 10 MG PO TABS
60.0000 mg | ORAL_TABLET | Freq: Once | ORAL | Status: AC
  Administered 2018-11-04: 60 mg via ORAL
  Filled 2018-11-04: qty 1

## 2018-11-04 MED ORDER — PREDNISONE 20 MG PO TABS: ORAL_TABLET | ORAL | 0 refills | Status: DC

## 2018-11-04 MED ORDER — BENZONATATE 100 MG PO CAPS: 100.0000 mg | ORAL_CAPSULE | Freq: Three times a day (TID) | ORAL | 0 refills | Status: DC | PRN

## 2018-11-04 MED ORDER — ALBUTEROL SULFATE HFA 108 (90 BASE) MCG/ACT IN AERS
2.0000 | INHALATION_SPRAY | Freq: Once | RESPIRATORY_TRACT | Status: AC
  Administered 2018-11-04: 2 via RESPIRATORY_TRACT
  Filled 2018-11-04: qty 6.7

## 2018-11-04 NOTE — ED Triage Notes (Addendum)
Pt c/o prod cough-was seen by PCP 3 weeks ago-had CXR-negative covid test and abx-feels no better-NAD

## 2018-11-04 NOTE — ED Provider Notes (Signed)
[facility] EMERGENCY DEPARTMENT Provider Note   CSN: [id number] Arrival date & time: 11/04/18  2006    History   Chief Complaint Chief Complaint  Patient presents with  . Cough    HPI Marvin Romero is a 68 y.o. male.     HPI  68 year old male presents with cough and concern for bronchitis.  He is been coughing for over 3 weeks.  Saw his doctor when it first started and was given a shot of antibiotic, shot of steroid and discharged home.  He states he was not discharged on steroids but was discharged on antibiotics.  Despite this he continues to cough.  It is productive of white sputum.  No fevers, shortness of breath.  He had some chest pain earlier when he coughed but none now.  No vomiting.  He was tested for COVID-19 earlier when this first started.  Past Medical History:  Diagnosis Date  . Asthma   . COPD (chronic obstructive pulmonary disease) (HCC)   . Hypertension   . Seasonal allergies   . Sleep apnea     Patient Active Problem List   Diagnosis Date Noted  . Anemia of chronic disease 08/26/2016  . Asthma, chronic, unspecified asthma severity, with acute exacerbation 08/26/2016  . Hypomagnesemia 08/26/2016  . Elevated troponin 08/26/2016    Past Surgical History:  Procedure Laterality Date  . colonostomy    . COLOSTOMY REVERSAL    . NECK SURGERY          Home Medications    Prior to Admission medications   Medication Sig Start Date End Date Taking? Authorizing Provider  aspirin 81 MG chewable tablet Chew 81 mg by mouth daily.    [provider]  benzonatate (TESSALON) 100 MG capsule Take 1 capsule (100 mg total) by mouth 3 (three) times daily as needed for cough. 11/04/18   Pricilla LovelessGoldston, Kerry-Anne Mezo, MD  doxycycline (VIBRAMYCIN) 100 MG capsule Take 1 capsule (100 mg total) by mouth 2 (two) times daily. 04/24/18   Bethel BornGekas, Kelly Marie, PA-C  Fluticasone-Salmeterol (ADVAIR DISKUS) 250-50 MCG/DOSE AEPB Inhale 1 puff into the lungs 2 (two) times  daily. 04/24/18   Bethel BornGekas, Kelly Marie, PA-C  guaiFENesin (ROBITUSSIN) 100 MG/5ML liquid Take 5-10 mLs (100-200 mg total) by mouth every 4 (four) hours as needed for cough. 04/26/18   Derwood KaplanNanavati, Ankit, MD  Ipratropium-Albuterol (COMBIVENT) 20-100 MCG/ACT AERS respimat Inhale 1 puff into the lungs every 6 (six) hours. 08/26/16   Calvert Cantorizwan, Saima, MD  ipratropium-albuterol (DUONEB) 0.5-2.5 (3) MG/3ML SOLN Take 3 mLs by nebulization every 6 (six) hours as needed (dyspnea). 08/26/16   Calvert Cantorizwan, Saima, MD  predniSONE (DELTASONE) 20 MG tablet 3 tabs po daily x 2 days, then 2 tabs x 3 days, then 1.5 tabs x 3 days, then 1 tab x 3 days, then 0.5 tabs x 3 days 11/05/18   Pricilla LovelessGoldston, Arsema Tusing, MD  sodium chloride HYPERTONIC 3 % nebulizer solution Take by nebulization as needed for other. 04/26/18   Derwood KaplanNanavati, Ankit, MD    Family History No family history on file.  Social History Social History   Tobacco Use  . Smoking status: Never Smoker  . Smokeless tobacco: Never Used  Substance Use Topics  . Alcohol use: No  . Drug use: No     Allergies   Patient has no known allergies.   Review of Systems Review of Systems  Constitutional: Negative for fever.  Respiratory: Positive for cough. Negative for shortness of breath.   Cardiovascular: Negative for  chest pain.  Gastrointestinal: Negative for vomiting.  All other systems reviewed and are negative.    Physical Exam Updated Vital Signs BP (!) 150/96 (BP Location: Left Arm)   Pulse (!) 108   Temp 98.8 F (37.1 C) (Oral)   Resp 20   Ht 6' (1.829 m)   Wt 96.2 kg   SpO2 98%   BMI 28.75 kg/m   Physical Exam Vitals signs and nursing note reviewed.  Constitutional:      General: He is not in acute distress.    Appearance: He is well-developed. He is not ill-appearing or diaphoretic.  HENT:     Head: Normocephalic and atraumatic.     Right Ear: External ear normal.     Left Ear: External ear normal.     Nose: Nose normal.  Eyes:     General:         Right eye: No discharge.        Left eye: No discharge.  Neck:     Musculoskeletal: Neck supple.  Cardiovascular:     Rate and Rhythm: Normal rate and regular rhythm.     Heart sounds: Normal heart sounds.  Pulmonary:     Effort: Pulmonary effort is normal. No tachypnea or accessory muscle usage.     Breath sounds: Wheezing (diffuse, expiratory) present.     Comments: Speaks in complete sentences Abdominal:     Palpations: Abdomen is soft.     Tenderness: There is no abdominal tenderness.  Skin:    General: Skin is warm and dry.  Neurological:     Mental Status: He is alert.  Psychiatric:        Mood and Affect: Mood is not anxious.      ED Treatments / Results  Labs (all labs ordered are listed, but only abnormal results are displayed) Labs Reviewed - No data to display  EKG None  Radiology No results found.  Procedures Procedures (including critical care time)  Medications Ordered in ED Medications  predniSONE (DELTASONE) tablet 60 mg (has no administration in time range)  albuterol (VENTOLIN HFA) 108 (90 Base) MCG/ACT inhaler 2 puff (has no administration in time range)     Initial Impression / Assessment and Plan / ED Course  I have reviewed the triage vital signs and the nursing notes.  Pertinent labs & imaging results that were available during my care of the patient were reviewed by me and considered in my medical decision making (see chart for details).        Patient has diffuse wheezing.  Probably COPD.  He already took a course of antibiotics but I think he needs steroids in addition to albuterol.  He has run out of his albuterol and I will give him another here.  He declines chest x-ray and repeat COVID test.  He just wants steroids and to go home.  Return precautions.  Marvin Romero was evaluated in Emergency Department on 11/04/2018 for the symptoms described in the history of present illness. He was evaluated in the context of the global  COVID-19 pandemic, which necessitated consideration that the patient might be at risk for infection with the SARS-CoV-2 virus that causes COVID-19. Institutional protocols and algorithms that pertain to the evaluation of patients at risk for COVID-19 are in a state of rapid change based on information released by regulatory bodies including the CDC and federal and state organizations. These policies and algorithms were followed during the patient's care in the ED.   Final  Clinical Impressions(s) / ED Diagnoses   Final diagnoses:  Bronchitis    ED Discharge Orders         Ordered    predniSONE (DELTASONE) 20 MG tablet     11/04/18 2106    benzonatate (TESSALON) 100 MG capsule  3 times daily PRN     11/04/18 2106           Sherwood Gambler, MD 11/04/18 2107

## 2018-11-04 NOTE — Discharge Instructions (Addendum)
Use your albuterol inhaler 2 puffs every 4 hours.  If you are needing it more than this, return to the ER or see your doctor.

## 2018-11-04 NOTE — ED Notes (Signed)
Pt given Rx x 2 for tessalon and prednisone. Ambulated to d/c window with steady gait

## 2018-12-14 ENCOUNTER — Emergency Department (HOSPITAL_BASED_OUTPATIENT_CLINIC_OR_DEPARTMENT_OTHER)
Admission: EM | Admit: 2018-12-14 | Discharge: 2018-12-15 | Disposition: A | Discharge status: Home / Self Care | Payer: Medicare HMO | Attending: Emergency Medicine | Admitting: Emergency Medicine

## 2018-12-14 ENCOUNTER — Other Ambulatory Visit: Payer: Self-pay

## 2018-12-14 DIAGNOSIS — I10 Essential (primary) hypertension: Secondary | ICD-10-CM | POA: Diagnosis not present

## 2018-12-14 DIAGNOSIS — J449 Chronic obstructive pulmonary disease, unspecified: Secondary | ICD-10-CM | POA: Insufficient documentation

## 2018-12-14 DIAGNOSIS — J069 Acute upper respiratory infection, unspecified: Secondary | ICD-10-CM | POA: Insufficient documentation

## 2018-12-14 DIAGNOSIS — R05 Cough: Secondary | ICD-10-CM | POA: Diagnosis present

## 2018-12-14 DIAGNOSIS — Z20828 Contact with and (suspected) exposure to other viral communicable diseases: Secondary | ICD-10-CM | POA: Diagnosis not present

## 2018-12-14 DIAGNOSIS — Z7982 Long term (current) use of aspirin: Secondary | ICD-10-CM | POA: Diagnosis not present

## 2018-12-14 DIAGNOSIS — J441 Chronic obstructive pulmonary disease with (acute) exacerbation: Secondary | ICD-10-CM

## 2018-12-14 NOTE — ED Notes (Signed)
Resp. Therapist listened to Pt. Lungs in triage

## 2018-12-14 NOTE — ED Triage Notes (Addendum)
Cough, sob, and headache x 3 days. He was seen by his MD yesterday and started on prednisone and albuterol with no relief.

## 2018-12-14 NOTE — ED Notes (Signed)
Pt. Walked to back with no difficulty with Triage RN

## 2018-12-15 ENCOUNTER — Emergency Department (HOSPITAL_BASED_OUTPATIENT_CLINIC_OR_DEPARTMENT_OTHER): Payer: Medicare HMO

## 2018-12-15 LAB — BASIC METABOLIC PANEL
Anion gap: 10 (ref 5–15)
BUN: 17 mg/dL (ref 8–23)
CO2: 23 mmol/L (ref 22–32)
Calcium: 9.5 mg/dL (ref 8.9–10.3)
Chloride: 107 mmol/L (ref 98–111)
Creatinine, Ser: 1.23 mg/dL (ref 0.61–1.24)
GFR calc Af Amer: >60 mL/min (ref >60)
GFR calc non Af Amer: >60 mL/min (ref >60)
Glucose, Bld: 102 mg/dL — ABNORMAL HIGH (ref 70–99)
Potassium: 3.7 mmol/L (ref 3.5–5.1)
Sodium: 140 mmol/L (ref 135–145)

## 2018-12-15 LAB — CBC WITH DIFFERENTIAL/PLATELET
Abs Immature Granulocytes: 0.08 10*3/uL — ABNORMAL HIGH (ref 0.00–0.07)
Basophils Absolute: 0.1 10*3/uL (ref 0.0–0.1)
Basophils Relative: 0 %
Eosinophils Absolute: 1 10*3/uL — ABNORMAL HIGH (ref 0.0–0.5)
Eosinophils Relative: 5 %
HCT: 36.6 % — ABNORMAL LOW (ref 39.0–52.0)
Hemoglobin: 12 g/dL — ABNORMAL LOW (ref 13.0–17.0)
Immature Granulocytes: 0 %
Lymphocytes Relative: 24 %
Lymphs Abs: 4.5 10*3/uL — ABNORMAL HIGH (ref 0.7–4.0)
MCH: 29.1 pg (ref 26.0–34.0)
MCHC: 32.8 g/dL (ref 30.0–36.0)
MCV: 88.8 fL (ref 80.0–100.0)
Monocytes Absolute: 1.5 10*3/uL — ABNORMAL HIGH (ref 0.1–1.0)
Monocytes Relative: 8 %
Neutro Abs: 11.8 10*3/uL — ABNORMAL HIGH (ref 1.7–7.7)
Neutrophils Relative %: 63 %
Platelets: 289 10*3/uL (ref 150–400)
RBC: 4.12 MIL/uL — ABNORMAL LOW (ref 4.22–5.81)
RDW: 15.7 % — ABNORMAL HIGH (ref 11.5–15.5)
WBC: 19 10*3/uL — ABNORMAL HIGH (ref 4.0–10.5)
nRBC: 0 % (ref 0.0–0.2)

## 2018-12-15 LAB — SARS CORONAVIRUS 2 BY RT PCR (HOSPITAL ORDER, PERFORMED IN ~~LOC~~ HOSPITAL LAB): SARS Coronavirus 2: NEGATIVE

## 2018-12-15 MED ORDER — BENZONATATE 100 MG PO CAPS
100.0000 mg | ORAL_CAPSULE | Freq: Once | ORAL | Status: AC
  Administered 2018-12-15: 100 mg via ORAL
  Filled 2018-12-15: qty 1

## 2018-12-15 MED ORDER — IPRATROPIUM BROMIDE 0.02 % IN SOLN: 0.5000 mg | Freq: Once | RESPIRATORY_TRACT | Status: DC

## 2018-12-15 MED ORDER — METHYLPREDNISOLONE SODIUM SUCC 125 MG IJ SOLR
125.0000 mg | Freq: Once | INTRAMUSCULAR | Status: AC
  Administered 2018-12-15: 125 mg via INTRAVENOUS
  Filled 2018-12-15: qty 2

## 2018-12-15 MED ORDER — ALBUTEROL SULFATE (2.5 MG/3ML) 0.083% IN NEBU: 5.0000 mg | INHALATION_SOLUTION | Freq: Once | RESPIRATORY_TRACT | Status: DC

## 2018-12-15 MED ORDER — ALBUTEROL SULFATE HFA 108 (90 BASE) MCG/ACT IN AERS
8.0000 | INHALATION_SPRAY | Freq: Once | RESPIRATORY_TRACT | Status: AC
  Administered 2018-12-15: 8 via RESPIRATORY_TRACT

## 2018-12-15 MED ORDER — ALBUTEROL SULFATE HFA 108 (90 BASE) MCG/ACT IN AERS
8.0000 | INHALATION_SPRAY | Freq: Once | RESPIRATORY_TRACT | Status: AC
  Administered 2018-12-15: 8 via RESPIRATORY_TRACT
  Filled 2018-12-15: qty 6.7

## 2018-12-15 MED ORDER — IPRATROPIUM BROMIDE HFA 17 MCG/ACT IN AERS
4.0000 | INHALATION_SPRAY | Freq: Once | RESPIRATORY_TRACT | Status: AC
  Administered 2018-12-15: 4 via RESPIRATORY_TRACT
  Filled 2018-12-15: qty 12.9

## 2018-12-15 NOTE — ED Provider Notes (Signed)
TIME SEEN: 12:42 AM  CHIEF COMPLAINT: Cough, shortness of breath, wheezing  HPI: Patient is a 68 year old male with history of asthma, COPD, hypertension who presents to the emergency department with 3 days of cough, shortness of breath and wheezing.  No fevers or chills.  No chest pain.  No vomiting or diarrhea.  States he has had a sore throat.  Patient saw his primary doctor was put on prednisone and azithromycin on the 10th.  No known COVID exposures.  States he has been staying at home.  ROS: See HPI Constitutional: no fever  Eyes: no drainage  ENT: no runny nose   Cardiovascular:  no chest pain  Resp:  SOB  GI: no vomiting GU: no dysuria Integumentary: no rash  Allergy: no hives  Musculoskeletal: no leg swelling  Neurological: no slurred speech ROS otherwise negative  PAST MEDICAL HISTORY/PAST SURGICAL HISTORY:  Past Medical History:  Diagnosis Date  . Asthma   . COPD (chronic obstructive pulmonary disease) ([place])   . Hypertension   . Seasonal allergies   . Sleep apnea     MEDICATIONS:  Prior to Admission medications   Medication Sig Start Date End Date Taking? Authorizing Provider  aspirin 81 MG chewable tablet Chew 81 mg by mouth daily.    [provider]  benzonatate (TESSALON) 100 MG capsule Take 1 capsule (100 mg total) by mouth 3 (three) times daily as needed for cough. 11/04/18   Sherwood Gambler, MD  doxycycline (VIBRAMYCIN) 100 MG capsule Take 1 capsule (100 mg total) by mouth 2 (two) times daily. 04/24/18   Recardo Evangelist, PA-C  Fluticasone-Salmeterol (ADVAIR DISKUS) 250-50 MCG/DOSE AEPB Inhale 1 puff into the lungs 2 (two) times daily. 04/24/18   Recardo Evangelist, PA-C  guaiFENesin (ROBITUSSIN) 100 MG/5ML liquid Take 5-10 mLs (100-200 mg total) by mouth every 4 (four) hours as needed for cough. 04/26/18   Varney Biles, MD  Ipratropium-Albuterol (COMBIVENT) 20-100 MCG/ACT AERS respimat Inhale 1 puff into the lungs every 6 (six) hours. 08/26/16    Debbe Odea, MD  ipratropium-albuterol (DUONEB) 0.5-2.5 (3) MG/3ML SOLN Take 3 mLs by nebulization every 6 (six) hours as needed (dyspnea). 08/26/16   Debbe Odea, MD  predniSONE (DELTASONE) 20 MG tablet 3 tabs po daily x 2 days, then 2 tabs x 3 days, then 1.5 tabs x 3 days, then 1 tab x 3 days, then 0.5 tabs x 3 days 11/05/18   Sherwood Gambler, MD  sodium chloride HYPERTONIC 3 % nebulizer solution Take by nebulization as needed for other. 04/26/18   Varney Biles, MD    ALLERGIES:  No Known Allergies  SOCIAL HISTORY:  Social History   Tobacco Use  . Smoking status: Never Smoker  . Smokeless tobacco: Never Used  Substance Use Topics  . Alcohol use: No    FAMILY HISTORY: No family history on file.  EXAM: BP (!) 149/91   Pulse 96   Temp 98.2 F (36.8 C) (Oral)   Resp 20   Ht 6' (1.829 m)   Wt 99.3 kg   SpO2 98%   BMI 29.70 kg/m  CONSTITUTIONAL: Alert and oriented and responds appropriately to questions. Well-appearing; well-nourished HEAD: Normocephalic EYES: Conjunctivae clear, pupils appear equal, EOMI ENT: normal nose; moist mucous membranes; No pharyngeal erythema or petechiae, no tonsillar hypertrophy or exudate, no uvular deviation, no unilateral swelling, no trismus or drooling, no muffled voice, normal phonation, no stridor, no dental caries present, no drainable dental abscess noted, no Ludwig's angina, tongue sits flat  in the bottom of the mouth, no angioedema, no facial erythema or warmth, no facial swelling; no pain with movement of the neck. NECK: Supple, no meningismus, no nuchal rigidity, no LAD  CARD: RRR; S1 and S2 appreciated; no murmurs, no clicks, no rubs, no gallops RESP: Normal chest excursion without splinting or tachypnea; breath sounds equal bilaterally but he does have scattered inspiratory and expiratory wheezes in all lung fields, no rhonchi or rales, no hypoxia or respiratory distress, speaking full sentences ABD/GI: Normal bowel sounds;  non-distended; soft, non-tender, no rebound, no guarding, no peritoneal signs, no hepatosplenomegaly BACK:  The back appears normal and is non-tender to palpation, there is no CVA tenderness EXT: Normal ROM in all joints; non-tender to palpation; no edema; normal capillary refill; no cyanosis, no calf tenderness or swelling    SKIN: Normal color for age and race; warm; no rash NEURO: Moves all extremities equally PSYCH: The patient's mood and manner are appropriate. Grooming and personal hygiene are appropriate.  MEDICAL DECISION MAKING: Patient here with COPD exacerbation.  He may need admission to the hospital.  He is complaining of cough and sore throat.  Will obtain COVID swab.  Will obtain labs, chest x-ray.  EKG shows no ischemic abnormality.  Doubt ACS, PE or dissection.  ED PROGRESS: Patient given albuterol, Atrovent by inhaler.  Chest x-ray clear.  COVID has come back negative.  Have offered him a nebulizer treatment he but he states he is feeling much better and would like discharge home.  He does have some mild expiratory wheezes at his bases but overall his lung sounds have improved significantly.  Have recommended he continue his prednisone and azithromycin as prescribed by his PCP.  I do not feel at this time he requires admission to the hospital.  We did discuss return precautions.  Suspect symptoms caused by a viral URI and COPD exacerbation.   At this time, I do not feel there is any life-threatening condition present. I have reviewed and discussed all results (EKG, imaging, lab, urine as appropriate) and exam findings with patient/family. I have reviewed nursing notes and appropriate previous records.  I feel the patient is safe to be discharged home without further emergent workup and can continue workup as an outpatient as needed. Discussed usual and customary return precautions. Patient/family verbalize understanding and are comfortable with this plan.  Outpatient follow-up has been  provided as needed. All questions have been answered.      EKG Interpretation  Date/Time:  Wednesday December 15 2018 01:15:57 EDT Ventricular Rate:  86 PR Interval:    QRS Duration: 90 QT Interval:  366 QTC Calculation: 438 R Axis:   11 Text Interpretation:  Sinus rhythm LVH by voltage No significant change since last tracing Confirmed by Pragya Lofaso, Baxter Hire [phone]) on 12/15/2018 1:22:04 AM          Nellie Chevalier, Layla Maw, DO 12/15/18 0258

## 2018-12-15 NOTE — Discharge Instructions (Addendum)
Please continue your prednisone and azithromycin as prescribed.  Your chest x-ray today showed no pneumonia.  Your COVID swab was negative.

## 2019-11-08 ENCOUNTER — Encounter: Payer: Self-pay | Admitting: Internal Medicine

## 2019-11-23 ENCOUNTER — Institutional Professional Consult (permissible substitution): Payer: Medicare HMO | Admitting: Internal Medicine

## 2019-11-29 DIAGNOSIS — J42 Unspecified chronic bronchitis: Secondary | ICD-10-CM | POA: Insufficient documentation

## 2020-05-16 ENCOUNTER — Encounter (HOSPITAL_BASED_OUTPATIENT_CLINIC_OR_DEPARTMENT_OTHER): Payer: Self-pay | Admitting: Emergency Medicine

## 2020-05-16 ENCOUNTER — Emergency Department (HOSPITAL_BASED_OUTPATIENT_CLINIC_OR_DEPARTMENT_OTHER): Payer: Medicare HMO

## 2020-05-16 ENCOUNTER — Other Ambulatory Visit: Payer: Self-pay

## 2020-05-16 ENCOUNTER — Emergency Department (HOSPITAL_BASED_OUTPATIENT_CLINIC_OR_DEPARTMENT_OTHER)
Admission: EM | Admit: 2020-05-16 | Discharge: 2020-05-16 | Discharge status: Against Medical Advice | Payer: Medicare HMO | Attending: Emergency Medicine | Admitting: Emergency Medicine

## 2020-05-16 DIAGNOSIS — J449 Chronic obstructive pulmonary disease, unspecified: Secondary | ICD-10-CM | POA: Insufficient documentation

## 2020-05-16 DIAGNOSIS — N179 Acute kidney failure, unspecified: Secondary | ICD-10-CM | POA: Diagnosis present

## 2020-05-16 DIAGNOSIS — E871 Hypo-osmolality and hyponatremia: Secondary | ICD-10-CM

## 2020-05-16 DIAGNOSIS — I700 Atherosclerosis of aorta: Secondary | ICD-10-CM | POA: Diagnosis not present

## 2020-05-16 DIAGNOSIS — I10 Essential (primary) hypertension: Secondary | ICD-10-CM | POA: Diagnosis not present

## 2020-05-16 DIAGNOSIS — R197 Diarrhea, unspecified: Secondary | ICD-10-CM | POA: Diagnosis present

## 2020-05-16 DIAGNOSIS — J45909 Unspecified asthma, uncomplicated: Secondary | ICD-10-CM | POA: Diagnosis not present

## 2020-05-16 DIAGNOSIS — Z7982 Long term (current) use of aspirin: Secondary | ICD-10-CM | POA: Diagnosis not present

## 2020-05-16 DIAGNOSIS — K529 Noninfective gastroenteritis and colitis, unspecified: Secondary | ICD-10-CM | POA: Diagnosis present

## 2020-05-16 DIAGNOSIS — R109 Unspecified abdominal pain: Secondary | ICD-10-CM | POA: Diagnosis not present

## 2020-05-16 DIAGNOSIS — U071 COVID-19: Secondary | ICD-10-CM | POA: Diagnosis not present

## 2020-05-16 LAB — BASIC METABOLIC PANEL
Anion gap: 16 — ABNORMAL HIGH (ref 5–15)
BUN: 52 mg/dL — ABNORMAL HIGH (ref 8–23)
CO2: 21 mmol/L — ABNORMAL LOW (ref 22–32)
Calcium: 9.7 mg/dL (ref 8.9–10.3)
Chloride: 88 mmol/L — ABNORMAL LOW (ref 98–111)
Creatinine, Ser: 3.76 mg/dL — ABNORMAL HIGH (ref 0.61–1.24)
GFR, Estimated: 17 mL/min — ABNORMAL LOW (ref >60)
Glucose, Bld: 158 mg/dL — ABNORMAL HIGH (ref 70–99)
Potassium: 4.1 mmol/L (ref 3.5–5.1)
Sodium: 125 mmol/L — ABNORMAL LOW (ref 135–145)

## 2020-05-16 LAB — CBC WITH DIFFERENTIAL/PLATELET
Abs Immature Granulocytes: 0.04 10*3/uL (ref 0.00–0.07)
Basophils Absolute: 0 10*3/uL (ref 0.0–0.1)
Basophils Relative: 0 %
Eosinophils Absolute: 0 10*3/uL (ref 0.0–0.5)
Eosinophils Relative: 0 %
HCT: 44.7 % (ref 39.0–52.0)
Hemoglobin: 15.5 g/dL (ref 13.0–17.0)
Immature Granulocytes: 0 %
Lymphocytes Relative: 13 %
Lymphs Abs: 1.8 10*3/uL (ref 0.7–4.0)
MCH: 29.1 pg (ref 26.0–34.0)
MCHC: 34.7 g/dL (ref 30.0–36.0)
MCV: 84 fL (ref 80.0–100.0)
Monocytes Absolute: 1.3 10*3/uL — ABNORMAL HIGH (ref 0.1–1.0)
Monocytes Relative: 9 %
Neutro Abs: 10.8 10*3/uL — ABNORMAL HIGH (ref 1.7–7.7)
Neutrophils Relative %: 78 %
Platelets: 366 10*3/uL (ref 150–400)
RBC: 5.32 MIL/uL (ref 4.22–5.81)
RDW: 13.7 % (ref 11.5–15.5)
WBC: 14 10*3/uL — ABNORMAL HIGH (ref 4.0–10.5)
nRBC: 0 % (ref 0.0–0.2)

## 2020-05-16 LAB — RESP PANEL BY RT-PCR (FLU A&B, COVID) ARPGX2
Influenza A by PCR: NEGATIVE
Influenza B by PCR: NEGATIVE
SARS Coronavirus 2 by RT PCR: POSITIVE — AB

## 2020-05-16 MED ORDER — CIPROFLOXACIN HCL 500 MG PO TABS: 500.0000 mg | ORAL_TABLET | Freq: Two times a day (BID) | ORAL | 0 refills | Status: AC

## 2020-05-16 MED ORDER — SODIUM CHLORIDE 0.9 % IV BOLUS
500.0000 mL | Freq: Once | INTRAVENOUS | Status: AC
  Administered 2020-05-16: 500 mL via INTRAVENOUS

## 2020-05-16 MED ORDER — SODIUM CHLORIDE 0.9 % IV BOLUS
1000.0000 mL | Freq: Once | INTRAVENOUS | Status: AC
  Administered 2020-05-16: 1000 mL via INTRAVENOUS

## 2020-05-16 MED ORDER — ONDANSETRON HCL 4 MG/2ML IJ SOLN
4.0000 mg | Freq: Once | INTRAMUSCULAR | Status: AC
  Administered 2020-05-16: 4 mg via INTRAVENOUS
  Filled 2020-05-16: qty 2

## 2020-05-16 MED ORDER — HEPARIN SODIUM (PORCINE) 5000 UNIT/ML IJ SOLN: 5000.0000 [IU] | Freq: Three times a day (TID) | INTRAMUSCULAR | Status: DC

## 2020-05-16 MED ORDER — METRONIDAZOLE 500 MG PO TABS: 500.0000 mg | ORAL_TABLET | Freq: Three times a day (TID) | ORAL | 0 refills | Status: AC

## 2020-05-16 MED ORDER — PIPERACILLIN-TAZOBACTAM 3.375 G IVPB 30 MIN
3.3750 g | Freq: Once | INTRAVENOUS | Status: AC
  Administered 2020-05-16: 3.375 g via INTRAVENOUS
  Filled 2020-05-16: qty 50

## 2020-05-16 MED ORDER — SODIUM CHLORIDE 0.9 % IV SOLN: INTRAVENOUS | Status: DC

## 2020-05-16 NOTE — Discharge Instructions (Addendum)
You were diagnosed with COVID, colitis (an infection of your bowels) and also acute renal failure.  We recommended that you be admitted to the hospital for IV fluids, antibiotics, and work-up of your kidney injury.  However you told us that you wanted to leave.  I discussed the risks of worsening dehydration, worsening kidney failure, which can lead to dialysis and death.  You told me that you would contact your primary care doctor and have them recheck your blood test.  I would strongly encourage you to call their office today and ensure that they recheck your blood test in the next 24 to 48 hours.  I also strongly encourage you to continue drinking lots of water at home to keep hydrated.  I prescribed you some antibiotics to help with your colitis, or any inflammation in your bowels.  You should complete the full course of these antibiotics.  Your COVID test was also positive today.  I would recommend quarantining for at least 10 days from the onset of your current symptoms.

## 2020-05-16 NOTE — Discharge Summary (Signed)
AMA NOTE    Patient ID: Marvin Romero MRN: [id number] DOB/AGE: [birth year] 70 y.o. Primary Care Physician:  [facility]  Admit date: 05/16/2020 Discharge date: 05/16/2020   PLEASE NOTE THAT PATIENT LEFT AGAINST MEDICAL ADVICE. Risks of worsening infection, renal function and death were explained in detail to the patient by the RN and Emergency Medicine MD as the patient was not transferred to [facility] hospital and he/she verbally understood the risks of leaving AMA.   Discharge Diagnoses:   Present on Admission: . AKI (acute kidney injury) (HCC) . Enterocolitis   Brief H and P: For complete details please refer to admission H and P, but according to Dr. Nicanor Alcon in brief patient presented with 1 day of constant severe watery diarrhea with associated cramping in hands and feet. He was seen by his PCP prior to arrival and he was treated with steroids.   Notable Labs: Na 125, K 4.1, BUN 52, Cr 3.76, WBC 14.0, Hb 15.5, COVID 19 positive.  Notable Imaging: CT renal stone study- notable for prior partial colectomy and mild enterocolitis Treatments: Zosyn, 1.5 L NS bolus and maintenance fluid  Plan was for the patient to be transferred to [facility] for further treatment of his AKI and enterocolitis however he stated to the RN that he felt better and insisted upon leaving AMA. ED MD had a bedside discussion with the patient.   Consults:  None   Allergies:  No Known Allergies  Discharge Medications: Please note that patient left AMA (against medical advice)   Hospital Course:  Active Problems:   AKI (acute kidney injury) (HCC)   Enterocolitis   Day of Discharge BP 130/90   Pulse 92   Temp 97.7 F (36.5 C) (Oral)   Resp 18   Ht 6' (1.829 m)   Wt 95.3 kg   SpO2 100%   BMI 28.48 kg/m   Physical Exam:  Patient left AMA prior to this being performed by the hospitalist   The results of significant diagnostics from this hospitalization (including imaging, microbiology,  ancillary and laboratory) are listed below for reference.    LAB RESULTS: Basic Metabolic Panel: Recent Labs  Lab 05/16/20 0414  NA 125*  K 4.1  CL 88*  CO2 21*  GLUCOSE 158*  BUN 52*  CREATININE 3.76*  CALCIUM 9.7   Liver Function Tests: No results for input(s): AST, ALT, ALKPHOS, BILITOT, PROT, ALBUMIN in the last 168 hours. No results for input(s): LIPASE, AMYLASE in the last 168 hours. No results for input(s): AMMONIA in the last 168 hours. CBC: Recent Labs  Lab 05/16/20 0414  WBC 14.0*  NEUTROABS 10.8*  HGB 15.5  HCT 44.7  MCV 84.0  PLT 366   Cardiac Enzymes: No results for input(s): CKTOTAL, CKMB, CKMBINDEX, TROPONINI in the last 168 hours. BNP: Invalid input(s): POCBNP CBG: No results for input(s): GLUCAP in the last 168 hours.  Significant Diagnostic Studies:  CT Renal Stone Study  Result Date: 05/16/2020 CLINICAL DATA:  Diarrhea, concern for dehydration, cramping in legs and hands EXAM: CT ABDOMEN AND PELVIS WITHOUT CONTRAST TECHNIQUE: Multidetector CT imaging of the abdomen and pelvis was performed following the standard protocol without IV contrast. COMPARISON:  None. FINDINGS: Lower chest: Lung bases are clear. Normal heart size. No pericardial effusion. Hepatobiliary: No discernible liver lesion on these unenhanced images. Normal liver attenuation. Smooth liver surface contour. Normal gallbladder and biliary tree without visible calcified gallstone. Pancreas: No pancreatic ductal dilatation or surrounding inflammatory changes. Spleen: Normal in size. No  concerning splenic lesions. Adrenals/Urinary Tract: Mild lobular thickening of the adrenal glands, can reflect some senescent hyperplasia without concerning dominant nodule. Kidneys are symmetric in size and normally located. No significant perinephric stranding. No discernible or contour deforming renal lesion. No urolithiasis or hydronephrosis. Urinary bladder is largely decompressed at the time of exam and  therefore poorly evaluated by CT imaging. Mild wall thickening may be related to underdistention though there is some faint perivesicular haze. Stomach/Bowel: Distal esophagus unremarkable. Stomach mildly distended with fluid and air. Broad-based 2.9 by 1.9 by 2.2 cm fat attenuation structure at the level of the gastric antrum may reflect an antral lipoma without concerning internal complexity. Distal stomach is otherwise unremarkable. Duodenum with a normal sweep across the midline abdomen. Much of the small bowel is fluid-filled. No bowel wall thickening or significant dilatation. Postsurgical changes from prior partial colectomy. Patent anastomoses. The colonic remnant is diffusely fluid-filled with lack of formed stool. No colonic thickening or dilatation. No evidence of bowel obstruction. Vascular/Lymphatic: Atherosclerotic calcifications within the abdominal aorta and branch vessels. No aneurysm or ectasia. No enlarged abdominopelvic lymph nodes. Reproductive: The prostate and seminal vesicles are unremarkable. Other: No abdominopelvic free fluid or free gas. No bowel containing hernias. Mild ventral rectus diastasis. Musculoskeletal: Postsurgical changes from prior L4-5 posterior decompression and fusion with interbody spacer incorporation. Partially sacralized L5 transverse processes bilaterally with pseudoarticulation with the adjacent sacral ala. Additional multilevel degenerative changes in the spine. Degenerative features also present in the hips and pelvis. IMPRESSION: 1. Prior partial colectomy. Much of the small bowel and colon is fluid-filled with lack of formed stool. Findings are nonspecific but can be seen with a mild enterocolitis. 2. Urinary bladder is largely decompressed at the time of exam and therefore poorly evaluated by CT imaging. Mild wall thickening may be related to underdistention though there is some faint perivesicular haze. Recommend correlation with urinalysis to exclude cystitis.  No visible urolithiasis or hydronephrosis. 3. Ovoid 2.9 cm fat attenuation structure at the level of the gastric antrum may reflect an antral lipoma without concerning internal complexity. Could consider outpatient visualization with endoscopy as clinically warranted. 4. Aortic Atherosclerosis (ICD10-I70.0). Electronically Signed   By: Kreg Shropshire M.D.   On: 05/16/2020 06:15    2D ECHO:   Disposition and Follow-up:    DISPOSITION: Patient left AMA. He/ she was advised to seek follow-up with primary care physician.     DISCHARGE FOLLOW-UP  Follow-up Information    [facility]. Call today.   Contact information: [address] [place] [address] [phone]        Go to  [facility] EMERGENCY DEPARTMENT.   Specialty: Emergency Medicine Why: If symptoms worsen Contact information: [address] [id number] [place] [address] [address] [phone]               Time spent on Discharge: 20 minutes  Signed:   Jae Dire D.O. [facility] 05/16/2020, 10:48 AM

## 2020-05-16 NOTE — ED Provider Notes (Signed)
I was called to the room as the patient wanted to leave the hospital.  We discussed leaving AGAINST MEDICAL ADVICE.  I made it clear to him that he has several issues ongoing that would require hospitalization.  This includes a colitis, infectious work-up pending, as well as evidence of significantly worsening kidney injury.  I explained kidney failure can lead to need for dialysis or death.  He understands this but states that he feels better, and feels that he was only dehydrated.  I said this was a possibility but it is not yet clear from our work-up.  I advised that he call his primary care provider today to ensure that they arranged to recheck his kidney function and electrolyte levels within the next 24 to 48 hours.  I strongly encouraged him to continue drinking fluids at home.  I also prescribed him antibiotics for continued treatment of his colitis for an additional 6 days.  Finally we talked about quarantining at home for COVID.  He had COVID several months ago, and is suspect he is currently reinfected with this.   Terald Sleeper, MD 05/16/20 1045

## 2020-05-16 NOTE — ED Triage Notes (Signed)
Pt states he thinks he is dehydrated  Pt states he has had diarrhea all day today and is having cramping in his legs and in his hands  Pt states he was seen by his dr today, Dr Hyacinth Meeker, and was given a steroid shot and was told to drink fluids  Pt states he has been drinking gatorade but it is not helping

## 2020-05-16 NOTE — ED Notes (Signed)
Patient transported to CT 

## 2020-05-16 NOTE — ED Notes (Signed)
He ambulates to blr. And back and tells me he passed a normal b.m. he also states "I feel much better".

## 2020-05-16 NOTE — ED Provider Notes (Addendum)
[facility] EMERGENCY DEPARTMENT Provider Note   CSN: [id number] Arrival date & time: 05/16/20  0006     History Chief Complaint  Patient presents with  . Diarrhea    Marvin Romero is a 70 y.o. male.  The history is provided by the patient.  Diarrhea Quality:  Watery Severity:  Severe Onset quality:  Sudden Duration:  1 day Timing:  Constant Progression:  Unchanged Relieved by:  Nothing Worsened by:  Nothing Associated symptoms: vomiting   Associated symptoms: no abdominal pain, no arthralgias and no fever   Vomiting:    Quality:  Stomach contents   Number of occurrences:  1   Severity:  Mild   Timing:  Rare   Progression:  Resolved Risk factors: no recent antibiotic use   Patient with uncontrolled diarrhea and one episode of emesis.  Patient was seen by PMD and given steroids and symptoms have not resolved.  Cramping in hands and feet and feels dehydrated.  No f/c/r.  No cough.  Is covid vaccinated.       Past Medical History:  Diagnosis Date  . Asthma   . COPD (chronic obstructive pulmonary disease) (HCC)   . Hypertension   . Seasonal allergies   . Sleep apnea     Patient Active Problem List   Diagnosis Date Noted  . Anemia of chronic disease 08/26/2016  . Asthma, chronic, unspecified asthma severity, with acute exacerbation 08/26/2016  . Hypomagnesemia 08/26/2016  . Elevated troponin 08/26/2016    Past Surgical History:  Procedure Laterality Date  . colonostomy    . COLOSTOMY REVERSAL    . NECK SURGERY         History reviewed. No pertinent family history.  Social History   Tobacco Use  . Smoking status: Never Smoker  . Smokeless tobacco: Never Used  Vaping Use  . Vaping Use: Never used  Substance Use Topics  . Alcohol use: No  . Drug use: No    Home Medications Prior to Admission medications   Medication Sig Start Date End Date Taking? Authorizing Provider  aspirin 81 MG chewable tablet Chew 81 mg by mouth daily.     [provider]  benzonatate (TESSALON) 100 MG capsule Take 1 capsule (100 mg total) by mouth 3 (three) times daily as needed for cough. 11/04/18   Pricilla Loveless, MD  doxycycline (VIBRAMYCIN) 100 MG capsule Take 1 capsule (100 mg total) by mouth 2 (two) times daily. 04/24/18   Bethel Born, PA-C  Fluticasone-Salmeterol (ADVAIR DISKUS) 250-50 MCG/DOSE AEPB Inhale 1 puff into the lungs 2 (two) times daily. 04/24/18   Bethel Born, PA-C  guaiFENesin (ROBITUSSIN) 100 MG/5ML liquid Take 5-10 mLs (100-200 mg total) by mouth every 4 (four) hours as needed for cough. 04/26/18   Derwood Kaplan, MD  Ipratropium-Albuterol (COMBIVENT) 20-100 MCG/ACT AERS respimat Inhale 1 puff into the lungs every 6 (six) hours. 08/26/16   Calvert Cantor, MD  ipratropium-albuterol (DUONEB) 0.5-2.5 (3) MG/3ML SOLN Take 3 mLs by nebulization every 6 (six) hours as needed (dyspnea). 08/26/16   Calvert Cantor, MD  predniSONE (DELTASONE) 20 MG tablet 3 tabs po daily x 2 days, then 2 tabs x 3 days, then 1.5 tabs x 3 days, then 1 tab x 3 days, then 0.5 tabs x 3 days 11/05/18   Pricilla Loveless, MD  sodium chloride HYPERTONIC 3 % nebulizer solution Take by nebulization as needed for other. 04/26/18   Derwood Kaplan, MD    Allergies    Patient  has no known allergies.  Review of Systems   Review of Systems  Constitutional: Negative for fever.  HENT: Negative for congestion.   Eyes: Negative for visual disturbance.  Respiratory: Negative for shortness of breath.   Cardiovascular: Negative for chest pain.  Gastrointestinal: Positive for diarrhea and vomiting. Negative for abdominal pain.  Genitourinary: Negative for difficulty urinating.  Musculoskeletal: Negative for arthralgias.  Skin: Negative for color change.  Neurological: Negative for dizziness.  Psychiatric/Behavioral: Negative for agitation.  All other systems reviewed and are negative.   Physical Exam Updated Vital Signs BP (!) 157/100   Pulse 80    Temp 98.2 F (36.8 C)   Resp 17   Ht 6' (1.829 m)   Wt 95.3 kg   SpO2 98%   BMI 28.48 kg/m   Physical Exam Vitals and nursing note reviewed.  Constitutional:      General: He is not in acute distress.    Appearance: Normal appearance.  HENT:     Head: Normocephalic and atraumatic.     Nose: Nose normal.  Eyes:     Conjunctiva/sclera: Conjunctivae normal.     Pupils: Pupils are equal, round, and reactive to light.  Cardiovascular:     Rate and Rhythm: Normal rate and regular rhythm.     Pulses: Normal pulses.     Heart sounds: Normal heart sounds.  Pulmonary:     Effort: Pulmonary effort is normal.     Breath sounds: Normal breath sounds.  Abdominal:     General: Abdomen is flat. Bowel sounds are normal.     Palpations: Abdomen is soft.     Tenderness: There is no abdominal tenderness. There is no guarding.  Musculoskeletal:        General: Normal range of motion.     Cervical back: Normal range of motion and neck supple.  Skin:    General: Skin is warm and dry.     Capillary Refill: Capillary refill takes less than 2 seconds.  Neurological:     General: No focal deficit present.     Mental Status: He is alert and oriented to person, place, and time.     Deep Tendon Reflexes: Reflexes normal.  Psychiatric:        Mood and Affect: Mood normal.        Behavior: Behavior normal.     ED Results / Procedures / Treatments   Labs (all labs ordered are listed, but only abnormal results are displayed) Results for orders placed or performed during the hospital encounter of 05/16/20  CBC with Differential/Platelet  Result Value Ref Range   WBC 14.0 (H) 4.0 - 10.5 K/uL   RBC 5.32 4.22 - 5.81 MIL/uL   Hemoglobin 15.5 13.0 - 17.0 g/dL   HCT 93.7 16.9 - 67.8 %   MCV 84.0 80.0 - 100.0 fL   MCH 29.1 26.0 - 34.0 pg   MCHC 34.7 30.0 - 36.0 g/dL   RDW 93.8 10.1 - 75.1 %   Platelets 366 150 - 400 K/uL   nRBC 0.0 0.0 - 0.2 %   Neutrophils Relative % 78 %   Neutro Abs 10.8  (H) 1.7 - 7.7 K/uL   Lymphocytes Relative 13 %   Lymphs Abs 1.8 0.7 - 4.0 K/uL   Monocytes Relative 9 %   Monocytes Absolute 1.3 (H) 0.1 - 1.0 K/uL   Eosinophils Relative 0 %   Eosinophils Absolute 0.0 0.0 - 0.5 K/uL   Basophils Relative 0 %   Basophils  Absolute 0.0 0.0 - 0.1 K/uL   Immature Granulocytes 0 %   Abs Immature Granulocytes 0.04 0.00 - 0.07 K/uL  Basic metabolic panel  Result Value Ref Range   Sodium 125 (L) 135 - 145 mmol/L   Potassium 4.1 3.5 - 5.1 mmol/L   Chloride 88 (L) 98 - 111 mmol/L   CO2 21 (L) 22 - 32 mmol/L   Glucose, Bld 158 (H) 70 - 99 mg/dL   BUN 52 (H) 8 - 23 mg/dL   Creatinine, Ser 7.61 (H) 0.61 - 1.24 mg/dL   Calcium 9.7 8.9 - 60.7 mg/dL   GFR, Estimated 17 (L) >60 mL/min   Anion gap 16 (H) 5 - 15   No results found.  EKG None  Radiology No results found.  Procedures Procedures (including critical care time)  Medications Ordered in ED Medications  sodium chloride 0.9 % bolus 500 mL (0 mLs Intravenous Stopped 05/16/20 0458)  ondansetron (ZOFRAN) injection 4 mg (4 mg Intravenous Given 05/16/20 0417)  sodium chloride 0.9 % bolus 1,000 mL (1,000 mLs Intravenous New Bag/Given 05/16/20 0543)    ED Course  I have reviewed the triage vital signs and the nursing notes.  Pertinent labs & imaging results that were available during my care of the patient were reviewed by me and considered in my medical decision making (see chart for details).    Final Clinical Impression(s) / ED Diagnoses Final diagnoses:  Diarrhea, unspecified type  Acute renal failure, unspecified acute renal failure type (HCC)  Hyponatremia   Treatment of enterocolitis is broad spectrum antibiotics and hydration.  Patient has been cultured and IV abx initiated and IVF continued.  Patient does not have a surgical abdomen and does not require surgery consult.    Severe dehydration and acute renal failure.  Will require admission for hydration and serial labs.     Message sent  to admitting attending with CT results and plan    Ocean Kearley, MD 05/16/20 0630    Neel Buffone, MD 05/16/20 3710

## 2020-05-16 NOTE — ED Notes (Signed)
Pt is c/o chest pain that started while in the lobby  No acute distress noted

## 2020-05-16 NOTE — ED Notes (Signed)
Pt. Has me remove his IV and ambulates from our facility. I re-iterate the importance of follow-up d/t abnormal lab work and potential renal function impairments.

## 2020-05-16 NOTE — ED Notes (Addendum)
He has just announced "I feel better; so I'm going home. I'll go see my doctor and show him everything I had here" [sic]. He again capably ambulates to b.r. and back. I notify Dr. Renaye Rakers, who tells me he will speak with pt. asap.

## 2020-05-17 ENCOUNTER — Telehealth: Payer: Self-pay | Admitting: Physician Assistant

## 2020-05-17 NOTE — Telephone Encounter (Signed)
entered in error   

## 2020-05-18 DIAGNOSIS — N183 Chronic kidney disease, stage 3 unspecified: Secondary | ICD-10-CM | POA: Insufficient documentation

## 2020-05-18 DIAGNOSIS — E871 Hypo-osmolality and hyponatremia: Secondary | ICD-10-CM | POA: Insufficient documentation

## 2020-05-21 LAB — CULTURE, BLOOD (ROUTINE X 2)
Culture: NO GROWTH
Culture: NO GROWTH

## 2020-08-03 ENCOUNTER — Emergency Department (HOSPITAL_BASED_OUTPATIENT_CLINIC_OR_DEPARTMENT_OTHER)
Admission: EM | Admit: 2020-08-03 | Discharge: 2020-08-03 | Disposition: A | Discharge status: Home / Self Care | Payer: Medicare HMO | Attending: Emergency Medicine | Admitting: Emergency Medicine

## 2020-08-03 ENCOUNTER — Encounter (HOSPITAL_BASED_OUTPATIENT_CLINIC_OR_DEPARTMENT_OTHER): Payer: Self-pay | Admitting: Emergency Medicine

## 2020-08-03 ENCOUNTER — Other Ambulatory Visit: Payer: Self-pay

## 2020-08-03 ENCOUNTER — Emergency Department (HOSPITAL_BASED_OUTPATIENT_CLINIC_OR_DEPARTMENT_OTHER): Payer: Medicare HMO

## 2020-08-03 DIAGNOSIS — Z7951 Long term (current) use of inhaled steroids: Secondary | ICD-10-CM | POA: Insufficient documentation

## 2020-08-03 DIAGNOSIS — I10 Essential (primary) hypertension: Secondary | ICD-10-CM | POA: Insufficient documentation

## 2020-08-03 DIAGNOSIS — R0602 Shortness of breath: Secondary | ICD-10-CM | POA: Diagnosis present

## 2020-08-03 DIAGNOSIS — Z20822 Contact with and (suspected) exposure to covid-19: Secondary | ICD-10-CM | POA: Insufficient documentation

## 2020-08-03 DIAGNOSIS — J441 Chronic obstructive pulmonary disease with (acute) exacerbation: Secondary | ICD-10-CM | POA: Diagnosis not present

## 2020-08-03 DIAGNOSIS — Z79899 Other long term (current) drug therapy: Secondary | ICD-10-CM | POA: Diagnosis not present

## 2020-08-03 DIAGNOSIS — Z7982 Long term (current) use of aspirin: Secondary | ICD-10-CM | POA: Diagnosis not present

## 2020-08-03 HISTORY — DX: Unspecified chronic bronchitis: J42

## 2020-08-03 HISTORY — DX: Disorder of kidney and ureter, unspecified: N28.9

## 2020-08-03 LAB — BASIC METABOLIC PANEL
Anion gap: 10 (ref 5–15)
BUN: 12 mg/dL (ref 8–23)
CO2: 25 mmol/L (ref 22–32)
Calcium: 8.9 mg/dL (ref 8.9–10.3)
Chloride: 101 mmol/L (ref 98–111)
Creatinine, Ser: 1.3 mg/dL — ABNORMAL HIGH (ref 0.61–1.24)
GFR, Estimated: >60 mL/min (ref >60)
Glucose, Bld: 117 mg/dL — ABNORMAL HIGH (ref 70–99)
Potassium: 3.7 mmol/L (ref 3.5–5.1)
Sodium: 136 mmol/L (ref 135–145)

## 2020-08-03 LAB — RESP PANEL BY RT-PCR (FLU A&B, COVID) ARPGX2
Influenza A by PCR: NEGATIVE
Influenza B by PCR: NEGATIVE
SARS Coronavirus 2 by RT PCR: NEGATIVE

## 2020-08-03 LAB — CBC WITH DIFFERENTIAL/PLATELET
Abs Immature Granulocytes: 0.04 10*3/uL (ref 0.00–0.07)
Basophils Absolute: 0.1 10*3/uL (ref 0.0–0.1)
Basophils Relative: 1 %
Eosinophils Absolute: 0.5 10*3/uL (ref 0.0–0.5)
Eosinophils Relative: 3 %
HCT: 37.6 % — ABNORMAL LOW (ref 39.0–52.0)
Hemoglobin: 12.3 g/dL — ABNORMAL LOW (ref 13.0–17.0)
Immature Granulocytes: 0 %
Lymphocytes Relative: 17 %
Lymphs Abs: 2.5 10*3/uL (ref 0.7–4.0)
MCH: 28.7 pg (ref 26.0–34.0)
MCHC: 32.7 g/dL (ref 30.0–36.0)
MCV: 87.6 fL (ref 80.0–100.0)
Monocytes Absolute: 1.4 10*3/uL — ABNORMAL HIGH (ref 0.1–1.0)
Monocytes Relative: 9 %
Neutro Abs: 10.2 10*3/uL — ABNORMAL HIGH (ref 1.7–7.7)
Neutrophils Relative %: 70 %
Platelets: 281 10*3/uL (ref 150–400)
RBC: 4.29 MIL/uL (ref 4.22–5.81)
RDW: 14.5 % (ref 11.5–15.5)
WBC: 14.7 10*3/uL — ABNORMAL HIGH (ref 4.0–10.5)
nRBC: 0 % (ref 0.0–0.2)

## 2020-08-03 LAB — CBG MONITORING, ED: Glucose-Capillary: 109 mg/dL — ABNORMAL HIGH (ref 70–99)

## 2020-08-03 MED ORDER — IPRATROPIUM-ALBUTEROL 0.5-2.5 (3) MG/3ML IN SOLN
3.0000 mL | Freq: Once | RESPIRATORY_TRACT | Status: AC
  Administered 2020-08-03: 3 mL via RESPIRATORY_TRACT
  Filled 2020-08-03: qty 3

## 2020-08-03 MED ORDER — DEXAMETHASONE SODIUM PHOSPHATE 10 MG/ML IJ SOLN
10.0000 mg | Freq: Once | INTRAMUSCULAR | Status: AC
  Administered 2020-08-03: 10 mg via INTRAVENOUS
  Filled 2020-08-03: qty 1

## 2020-08-03 MED ORDER — RACEPINEPHRINE HCL 2.25 % IN NEBU
0.5000 mL | INHALATION_SOLUTION | Freq: Once | RESPIRATORY_TRACT | Status: AC
  Administered 2020-08-03: 0.5 mL via RESPIRATORY_TRACT
  Filled 2020-08-03: qty 0.5

## 2020-08-03 MED ORDER — IPRATROPIUM BROMIDE HFA 17 MCG/ACT IN AERS
2.0000 | INHALATION_SPRAY | Freq: Once | RESPIRATORY_TRACT | Status: AC
  Administered 2020-08-03: 2 via RESPIRATORY_TRACT
  Filled 2020-08-03: qty 12.9

## 2020-08-03 MED ORDER — SODIUM CHLORIDE 0.9 % IN NEBU
INHALATION_SOLUTION | RESPIRATORY_TRACT | Status: AC
  Filled 2020-08-03: qty 3

## 2020-08-03 MED ORDER — ALBUTEROL SULFATE HFA 108 (90 BASE) MCG/ACT IN AERS
8.0000 | INHALATION_SPRAY | Freq: Once | RESPIRATORY_TRACT | Status: AC
  Filled 2020-08-03: qty 6.7

## 2020-08-03 MED ORDER — MAGNESIUM SULFATE 2 GM/50ML IV SOLN
2.0000 g | Freq: Once | INTRAVENOUS | Status: AC
  Administered 2020-08-03: 2 g via INTRAVENOUS
  Filled 2020-08-03: qty 50

## 2020-08-03 MED ORDER — ALBUTEROL SULFATE HFA 108 (90 BASE) MCG/ACT IN AERS
INHALATION_SPRAY | RESPIRATORY_TRACT | Status: AC
  Administered 2020-08-03: 8 via RESPIRATORY_TRACT
  Filled 2020-08-03: qty 6.7
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
## 2020-08-03 NOTE — ED Triage Notes (Signed)
Pt with shob.

## 2022-01-29 ENCOUNTER — Other Ambulatory Visit: Payer: Self-pay

## 2022-01-29 ENCOUNTER — Encounter (HOSPITAL_BASED_OUTPATIENT_CLINIC_OR_DEPARTMENT_OTHER): Payer: Self-pay | Admitting: Emergency Medicine

## 2022-01-29 ENCOUNTER — Emergency Department (HOSPITAL_BASED_OUTPATIENT_CLINIC_OR_DEPARTMENT_OTHER)
Admission: EM | Admit: 2022-01-29 | Discharge: 2022-01-29 | Disposition: A | Discharge status: Home / Self Care | Payer: Medicare HMO | Attending: Emergency Medicine | Admitting: Emergency Medicine

## 2022-01-29 ENCOUNTER — Emergency Department (HOSPITAL_BASED_OUTPATIENT_CLINIC_OR_DEPARTMENT_OTHER): Payer: Medicare HMO

## 2022-01-29 DIAGNOSIS — Z7951 Long term (current) use of inhaled steroids: Secondary | ICD-10-CM | POA: Diagnosis not present

## 2022-01-29 DIAGNOSIS — I10 Essential (primary) hypertension: Secondary | ICD-10-CM | POA: Insufficient documentation

## 2022-01-29 DIAGNOSIS — Z20822 Contact with and (suspected) exposure to covid-19: Secondary | ICD-10-CM | POA: Diagnosis not present

## 2022-01-29 DIAGNOSIS — Z7982 Long term (current) use of aspirin: Secondary | ICD-10-CM | POA: Diagnosis not present

## 2022-01-29 DIAGNOSIS — Z79899 Other long term (current) drug therapy: Secondary | ICD-10-CM | POA: Insufficient documentation

## 2022-01-29 DIAGNOSIS — R0602 Shortness of breath: Secondary | ICD-10-CM | POA: Diagnosis present

## 2022-01-29 DIAGNOSIS — J441 Chronic obstructive pulmonary disease with (acute) exacerbation: Secondary | ICD-10-CM | POA: Insufficient documentation

## 2022-01-29 LAB — SARS CORONAVIRUS 2 BY RT PCR: SARS Coronavirus 2 by RT PCR: NEGATIVE

## 2022-01-29 MED ORDER — ALBUTEROL SULFATE (2.5 MG/3ML) 0.083% IN NEBU
2.5000 mg | INHALATION_SOLUTION | Freq: Once | RESPIRATORY_TRACT | Status: AC
  Administered 2022-01-29: 2.5 mg via RESPIRATORY_TRACT
  Filled 2022-01-29: qty 3

## 2022-01-29 MED ORDER — PREDNISONE 50 MG PO TABS
60.0000 mg | ORAL_TABLET | Freq: Once | ORAL | Status: AC
  Administered 2022-01-29: 60 mg via ORAL
  Filled 2022-01-29: qty 1

## 2022-01-29 MED ORDER — ALBUTEROL SULFATE HFA 108 (90 BASE) MCG/ACT IN AERS: 2.0000 | INHALATION_SPRAY | RESPIRATORY_TRACT | Status: DC | PRN

## 2022-01-29 MED ORDER — PREDNISONE 10 MG PO TABS: 40.0000 mg | ORAL_TABLET | Freq: Every day | ORAL | 0 refills | Status: DC

## 2022-01-29 MED ORDER — IPRATROPIUM-ALBUTEROL 0.5-2.5 (3) MG/3ML IN SOLN
3.0000 mL | Freq: Once | RESPIRATORY_TRACT | Status: AC
  Administered 2022-01-29: 3 mL via RESPIRATORY_TRACT
  Filled 2022-01-29: qty 3
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
## 2022-01-29 NOTE — Discharge Instructions (Addendum)
Return for any new or worse symptoms.  Usual albuterol inhaler take the prednisone as directed.  COVID testing here today was negative.  Continue your antibiotic that your doctor has you on.  Oxygen levels here today have been very good they are up in the upper 90% range.

## 2022-01-29 NOTE — ED Notes (Signed)
Hooked up to the monitor with the 5 lead, BP cuff and pulse ox. Pt also complaining of sob. Pt O2 stats 97% pt placed on 2lpm O2 by N/C. RT Daniel informed.

## 2022-01-29 NOTE — ED Triage Notes (Signed)
Patient arrived via POV c/o SHOB x 2 days post covid boosters. Patient states using inhaler and 2x neb at home with no relief. Patient states productive cough w/ white mucus. Patient is AO x 4, VS w/ elevated BP, slow gait.

## 2022-01-29 NOTE — ED Notes (Signed)
Patient transported to X-ray 

## 2022-06-18 ENCOUNTER — Emergency Department (HOSPITAL_BASED_OUTPATIENT_CLINIC_OR_DEPARTMENT_OTHER): Payer: Medicare HMO

## 2022-06-18 ENCOUNTER — Other Ambulatory Visit: Payer: Self-pay

## 2022-06-18 ENCOUNTER — Emergency Department (HOSPITAL_BASED_OUTPATIENT_CLINIC_OR_DEPARTMENT_OTHER)
Admission: EM | Admit: 2022-06-18 | Discharge: 2022-06-18 | Discharge status: Against Medical Advice | Payer: Medicare HMO | Attending: Emergency Medicine | Admitting: Emergency Medicine

## 2022-06-18 DIAGNOSIS — R0789 Other chest pain: Secondary | ICD-10-CM | POA: Insufficient documentation

## 2022-06-18 DIAGNOSIS — Z5321 Procedure and treatment not carried out due to patient leaving prior to being seen by health care provider: Secondary | ICD-10-CM | POA: Diagnosis not present

## 2022-06-18 DIAGNOSIS — Z7982 Long term (current) use of aspirin: Secondary | ICD-10-CM | POA: Diagnosis not present

## 2022-06-18 DIAGNOSIS — R0602 Shortness of breath: Secondary | ICD-10-CM | POA: Insufficient documentation

## 2022-06-18 LAB — CBC WITH DIFFERENTIAL/PLATELET
Abs Immature Granulocytes: 0.03 10*3/uL (ref 0.00–0.07)
Basophils Absolute: 0 10*3/uL (ref 0.0–0.1)
Basophils Relative: 0 %
Eosinophils Absolute: 0.3 10*3/uL (ref 0.0–0.5)
Eosinophils Relative: 3 %
HCT: 43.1 % (ref 39.0–52.0)
Hemoglobin: 14.1 g/dL (ref 13.0–17.0)
Immature Granulocytes: 0 %
Lymphocytes Relative: 24 %
Lymphs Abs: 2.8 10*3/uL (ref 0.7–4.0)
MCH: 29 pg (ref 26.0–34.0)
MCHC: 32.7 g/dL (ref 30.0–36.0)
MCV: 88.7 fL (ref 80.0–100.0)
Monocytes Absolute: 0.7 10*3/uL (ref 0.1–1.0)
Monocytes Relative: 6 %
Neutro Abs: 7.5 10*3/uL (ref 1.7–7.7)
Neutrophils Relative %: 67 %
Platelets: 263 10*3/uL (ref 150–400)
RBC: 4.86 MIL/uL (ref 4.22–5.81)
RDW: 13.6 % (ref 11.5–15.5)
WBC: 11.3 10*3/uL — ABNORMAL HIGH (ref 4.0–10.5)
nRBC: 0 % (ref 0.0–0.2)

## 2022-06-18 LAB — BASIC METABOLIC PANEL
Anion gap: 9 (ref 5–15)
BUN: 16 mg/dL (ref 8–23)
CO2: 25 mmol/L (ref 22–32)
Calcium: 9.3 mg/dL (ref 8.9–10.3)
Chloride: 101 mmol/L (ref 98–111)
Creatinine, Ser: 1.4 mg/dL — ABNORMAL HIGH (ref 0.61–1.24)
GFR, Estimated: 54 mL/min — ABNORMAL LOW (ref >60)
Glucose, Bld: 148 mg/dL — ABNORMAL HIGH (ref 70–99)
Potassium: 4 mmol/L (ref 3.5–5.1)
Sodium: 135 mmol/L (ref 135–145)

## 2022-06-18 LAB — TROPONIN I (HIGH SENSITIVITY): Troponin I (High Sensitivity): 20 ng/L — ABNORMAL HIGH (ref <18)

## 2022-06-18 NOTE — ED Triage Notes (Signed)
Pt arrives with chest pain that started an hour ago. Pt took 104m aspirin and acid reflux pill. Pt endorses SOB. Pt denies n/v.

## 2022-08-09 ENCOUNTER — Emergency Department (HOSPITAL_BASED_OUTPATIENT_CLINIC_OR_DEPARTMENT_OTHER): Payer: Medicare HMO

## 2022-08-09 ENCOUNTER — Emergency Department (HOSPITAL_BASED_OUTPATIENT_CLINIC_OR_DEPARTMENT_OTHER)
Admission: EM | Admit: 2022-08-09 | Discharge: 2022-08-09 | Disposition: A | Discharge status: Home / Self Care | Payer: Medicare HMO | Attending: Emergency Medicine | Admitting: Emergency Medicine

## 2022-08-09 ENCOUNTER — Other Ambulatory Visit: Payer: Self-pay

## 2022-08-09 DIAGNOSIS — Z7982 Long term (current) use of aspirin: Secondary | ICD-10-CM | POA: Insufficient documentation

## 2022-08-09 DIAGNOSIS — R0602 Shortness of breath: Secondary | ICD-10-CM | POA: Insufficient documentation

## 2022-08-09 DIAGNOSIS — I129 Hypertensive chronic kidney disease with stage 1 through stage 4 chronic kidney disease, or unspecified chronic kidney disease: Secondary | ICD-10-CM | POA: Diagnosis not present

## 2022-08-09 DIAGNOSIS — N183 Chronic kidney disease, stage 3 unspecified: Secondary | ICD-10-CM | POA: Insufficient documentation

## 2022-08-09 DIAGNOSIS — J45901 Unspecified asthma with (acute) exacerbation: Secondary | ICD-10-CM | POA: Diagnosis not present

## 2022-08-09 DIAGNOSIS — J449 Chronic obstructive pulmonary disease, unspecified: Secondary | ICD-10-CM | POA: Diagnosis not present

## 2022-08-09 DIAGNOSIS — R059 Cough, unspecified: Secondary | ICD-10-CM | POA: Diagnosis present

## 2022-08-09 LAB — CBC WITH DIFFERENTIAL/PLATELET
Abs Immature Granulocytes: 0.04 10*3/uL (ref 0.00–0.07)
Basophils Absolute: 0.1 10*3/uL (ref 0.0–0.1)
Basophils Relative: 1 %
Eosinophils Absolute: 1.3 10*3/uL — ABNORMAL HIGH (ref 0.0–0.5)
Eosinophils Relative: 11 %
HCT: 36.1 % — ABNORMAL LOW (ref 39.0–52.0)
Hemoglobin: 12.1 g/dL — ABNORMAL LOW (ref 13.0–17.0)
Immature Granulocytes: 0 %
Lymphocytes Relative: 29 %
Lymphs Abs: 3.3 10*3/uL (ref 0.7–4.0)
MCH: 29.2 pg (ref 26.0–34.0)
MCHC: 33.5 g/dL (ref 30.0–36.0)
MCV: 87 fL (ref 80.0–100.0)
Monocytes Absolute: 1 10*3/uL (ref 0.1–1.0)
Monocytes Relative: 9 %
Neutro Abs: 5.6 10*3/uL (ref 1.7–7.7)
Neutrophils Relative %: 50 %
Platelets: 245 10*3/uL (ref 150–400)
RBC: 4.15 MIL/uL — ABNORMAL LOW (ref 4.22–5.81)
RDW: 14 % (ref 11.5–15.5)
WBC: 11.3 10*3/uL — ABNORMAL HIGH (ref 4.0–10.5)
nRBC: 0 % (ref 0.0–0.2)

## 2022-08-09 LAB — BASIC METABOLIC PANEL
Anion gap: 9 (ref 5–15)
BUN: 15 mg/dL (ref 8–23)
CO2: 20 mmol/L — ABNORMAL LOW (ref 22–32)
Calcium: 8.5 mg/dL — ABNORMAL LOW (ref 8.9–10.3)
Chloride: 104 mmol/L (ref 98–111)
Creatinine, Ser: 1.44 mg/dL — ABNORMAL HIGH (ref 0.61–1.24)
GFR, Estimated: 53 mL/min — ABNORMAL LOW (ref >60)
Glucose, Bld: 128 mg/dL — ABNORMAL HIGH (ref 70–99)
Potassium: 3.3 mmol/L — ABNORMAL LOW (ref 3.5–5.1)
Sodium: 133 mmol/L — ABNORMAL LOW (ref 135–145)

## 2022-08-09 LAB — BRAIN NATRIURETIC PEPTIDE: B Natriuretic Peptide: 9.9 pg/mL (ref 0.0–100.0)

## 2022-08-09 MED ORDER — PREDNISONE 20 MG PO TABS: 40.0000 mg | ORAL_TABLET | Freq: Every day | ORAL | 0 refills | Status: AC

## 2022-08-09 MED ORDER — IPRATROPIUM-ALBUTEROL 0.5-2.5 (3) MG/3ML IN SOLN
3.0000 mL | RESPIRATORY_TRACT | Status: AC
  Administered 2022-08-09 (×2): 3 mL via RESPIRATORY_TRACT
  Filled 2022-08-09: qty 6

## 2022-08-09 MED ORDER — MAGNESIUM SULFATE 2 GM/50ML IV SOLN
2.0000 g | Freq: Once | INTRAVENOUS | Status: AC
  Administered 2022-08-09: 2 g via INTRAVENOUS
  Filled 2022-08-09: qty 50

## 2022-08-09 MED ORDER — IPRATROPIUM-ALBUTEROL 0.5-2.5 (3) MG/3ML IN SOLN
3.0000 mL | Freq: Once | RESPIRATORY_TRACT | Status: AC
  Administered 2022-08-09: 3 mL via RESPIRATORY_TRACT
  Filled 2022-08-09: qty 3

## 2022-08-09 MED ORDER — METHYLPREDNISOLONE SODIUM SUCC 125 MG IJ SOLR
125.0000 mg | Freq: Once | INTRAMUSCULAR | Status: AC
  Administered 2022-08-09: 125 mg via INTRAVENOUS
  Filled 2022-08-09: qty 2

## 2022-08-09 NOTE — ED Notes (Signed)
  Saturation Qualification for home oxygen need test:   Patient saturation on room air at rest =97% Patient saturation on room air while ambulating= 95%, HR 111, RR 18 and appeared in no distress.   Patient did not qualify for home oxygen

## 2022-08-09 NOTE — ED Triage Notes (Signed)
C/O progressive cough, hx of COPD. Course lung sounds, + wheezing. No relief from nebulizer at home.

## 2022-08-09 NOTE — ED Notes (Signed)
Pt provided discharge instructions and prescription information. Pt was given the opportunity to ask questions and questions were answered.   

## 2022-08-09 NOTE — Discharge Instructions (Signed)
Thank you for coming to [facility] Emergency Department. You were seen for difficulty breathing, wheezing. We did an exam, labs, and imaging, and these showed likely an asthma exacerbation. We had prescribed prednisone 40 mg to take once per day for 5 days.  Please use your at home nebulizer scheduled every 4-6 hours for the next 3 days and then use them as needed or otherwise prescribed. Please follow up with your primary care provider within 1 week.   Do not hesitate to return to the ED or call 911 if you experience: -Worsening symptoms -Chest pain -Shortness of breath -Lightheadedness, passing out -Fevers/chills -Anything else that concerns you
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
## 2022-08-29 ENCOUNTER — Other Ambulatory Visit (HOSPITAL_COMMUNITY): Payer: Self-pay | Admitting: Family Medicine

## 2022-08-29 DIAGNOSIS — R079 Chest pain, unspecified: Secondary | ICD-10-CM

## 2022-09-03 ENCOUNTER — Telehealth (HOSPITAL_COMMUNITY): Payer: Self-pay | Admitting: *Deleted

## 2022-09-03 MED ORDER — METOPROLOL TARTRATE 100 MG PO TABS: ORAL_TABLET | ORAL | 0 refills | Status: AC

## 2022-09-03 NOTE — Telephone Encounter (Signed)
Reaching out to patient to offer assistance regarding upcoming cardiac imaging study; pt verbalizes understanding of appt date/time, parking situation and where to check in, pre-test NPO status and medications ordered, and verified current allergies; name and call back number provided for further questions should they arise  Nery Kalisz RN Navigator Cardiac Imaging [facility] [phone] office [phone] cell  Patient to take 100mg metoprolol tartrate two hours prior to his cardiac CT scan. He is aware to arrive at 9am. 

## 2022-09-05 ENCOUNTER — Ambulatory Visit (HOSPITAL_COMMUNITY)
Admission: RE | Admit: 2022-09-05 | Discharge: 2022-09-05 | Disposition: A | Discharge status: Home / Self Care | Payer: Medicare HMO | Source: Ambulatory Visit | Attending: Family Medicine | Admitting: Family Medicine

## 2022-09-05 DIAGNOSIS — R079 Chest pain, unspecified: Secondary | ICD-10-CM | POA: Diagnosis present

## 2022-09-05 DIAGNOSIS — R072 Precordial pain: Secondary | ICD-10-CM | POA: Diagnosis not present

## 2022-09-05 DIAGNOSIS — I2510 Atherosclerotic heart disease of native coronary artery without angina pectoris: Secondary | ICD-10-CM | POA: Diagnosis not present

## 2022-09-05 MED ORDER — NITROGLYCERIN 0.4 MG SL SUBL
0.8000 mg | SUBLINGUAL_TABLET | Freq: Once | SUBLINGUAL | Status: AC
  Administered 2022-09-05: 0.8 mg via SUBLINGUAL

## 2022-09-05 MED ORDER — NITROGLYCERIN 0.4 MG SL SUBL
SUBLINGUAL_TABLET | SUBLINGUAL | Status: AC
  Filled 2022-09-05: qty 2

## 2022-09-05 MED ORDER — IOHEXOL 350 MG/ML SOLN
95.0000 mL | Freq: Once | INTRAVENOUS | Status: AC | PRN
  Administered 2022-09-05: 95 mL via INTRAVENOUS

## 2022-09-05 NOTE — Progress Notes (Signed)
Patient tolerated cardiac ct without distress, vss, instructed patient to drink fluids, d/t in no acute distress

## 2023-06-13 ENCOUNTER — Emergency Department (HOSPITAL_BASED_OUTPATIENT_CLINIC_OR_DEPARTMENT_OTHER)
Admission: EM | Admit: 2023-06-13 | Discharge: 2023-06-13 | Disposition: A | Discharge status: Home / Self Care | Payer: Medicare (Managed Care) | Attending: Emergency Medicine | Admitting: Emergency Medicine

## 2023-06-13 ENCOUNTER — Other Ambulatory Visit: Payer: Self-pay

## 2023-06-13 ENCOUNTER — Encounter (HOSPITAL_BASED_OUTPATIENT_CLINIC_OR_DEPARTMENT_OTHER): Payer: Self-pay | Admitting: Emergency Medicine

## 2023-06-13 ENCOUNTER — Emergency Department (HOSPITAL_BASED_OUTPATIENT_CLINIC_OR_DEPARTMENT_OTHER): Payer: Medicare (Managed Care)

## 2023-06-13 DIAGNOSIS — Z79899 Other long term (current) drug therapy: Secondary | ICD-10-CM | POA: Insufficient documentation

## 2023-06-13 DIAGNOSIS — S46012A Strain of muscle(s) and tendon(s) of the rotator cuff of left shoulder, initial encounter: Secondary | ICD-10-CM | POA: Insufficient documentation

## 2023-06-13 DIAGNOSIS — N189 Chronic kidney disease, unspecified: Secondary | ICD-10-CM | POA: Diagnosis not present

## 2023-06-13 DIAGNOSIS — X58XXXA Exposure to other specified factors, initial encounter: Secondary | ICD-10-CM | POA: Insufficient documentation

## 2023-06-13 DIAGNOSIS — S46912A Strain of unspecified muscle, fascia and tendon at shoulder and upper arm level, left arm, initial encounter: Secondary | ICD-10-CM

## 2023-06-13 DIAGNOSIS — M19012 Primary osteoarthritis, left shoulder: Secondary | ICD-10-CM | POA: Diagnosis not present

## 2023-06-13 DIAGNOSIS — M25512 Pain in left shoulder: Secondary | ICD-10-CM | POA: Diagnosis present

## 2023-06-13 DIAGNOSIS — I129 Hypertensive chronic kidney disease with stage 1 through stage 4 chronic kidney disease, or unspecified chronic kidney disease: Secondary | ICD-10-CM | POA: Insufficient documentation

## 2023-06-13 NOTE — ED Triage Notes (Signed)
 Ongoing left shoulder pain x 2 months, limited ROM. Has not been eval by Ortho

## 2023-06-13 NOTE — Discharge Instructions (Signed)
 You were seen in the emergency department for left shoulder pain The x-ray did not show any broken bones or dislocations but did show some arthritic changes in the shoulder For this reason we gave you the number for Dr.Marchiwany who is an orthopedic specialist Please follow-up with him in the office make an appointment to be seen to discuss further management for shoulder pain Take Tylenol  and Motrin  as directed for pain Continue to stretch and perform light activities Return to the emergency room for severe pain or any other concerns

## 2023-06-13 NOTE — ED Provider Notes (Signed)
 [place] [facility] Provider Note   CSN: [id number] Arrival date & time: 06/13/23  9060     History  Chief Complaint  Patient presents with   Shoulder Pain    Marvin Romero is a 73 y.o. male.With a history of hypertension and CKD presents to the ED for shoulder pain.  Ongoing left shoulder pain for the last 2 months.  Feels as though he is able to range his less and less.  No inciting injury or falls.  No other complaints at this time   Shoulder Pain      Home Medications Prior to Admission medications   Medication Sig Start Date End Date Taking? Authorizing Provider  amLODipine (NORVASC) 10 MG tablet Take 10 mg by mouth daily. Patient not taking: Reported on 09/05/2022    [provider]  aspirin 81 MG chewable tablet Chew 81 mg by mouth daily. Patient not taking: Reported on 09/05/2022    [provider]  Fluticasone -Salmeterol (ADVAIR DISKUS) 250-50 MCG/DOSE AEPB Inhale 1 puff into the lungs 2 (two) times daily. 04/24/18   Odis Burnard Jansky, PA-C  Ipratropium-Albuterol  (COMBIVENT) 20-100 MCG/ACT AERS respimat Inhale 1 puff into the lungs every 6 (six) hours. 08/26/16   Rizwan, Saima, MD  ipratropium-albuterol  (DUONEB) 0.5-2.5 (3) MG/3ML SOLN Take 3 mLs by nebulization every 6 (six) hours as needed (dyspnea). 08/26/16   Rizwan, Saima, MD  metoprolol  tartrate (LOPRESSOR ) 100 MG tablet Take tablet (100mg ) TWO hours prior to your cardiac CT scan. 09/03/22   Barbaraann Darryle Ned, MD  oxyCODONE (OXYCONTIN) 10 mg 12 hr tablet Take 7.5 mg by mouth daily.    [provider]  sildenafil (REVATIO) 20 MG tablet Take 20 mg by mouth as needed (for enhancement).    [provider]      Allergies    Patient has no known allergies.    Review of Systems   Review of Systems  Physical Exam Updated Vital Signs BP (!) 160/111   Pulse 79   Temp 98.2 F (36.8 C) (Oral)   Resp 16   SpO2 100%  Physical Exam Vitals and  nursing note reviewed.  HENT:     Head: Normocephalic and atraumatic.  Eyes:     Pupils: Pupils are equal, round, and reactive to light.  Cardiovascular:     Rate and Rhythm: Normal rate and regular rhythm.  Pulmonary:     Effort: Pulmonary effort is normal.     Breath sounds: Normal breath sounds.  Abdominal:     Palpations: Abdomen is soft.     Tenderness: There is no abdominal tenderness.  Musculoskeletal:     Comments: Limited range of motion of left shoulder with abduction and flexion No crepitus deformity or anterior tenderness 5 out of 5 motor strength throughout left hand and wrist with 2+ radial pulses bilaterally  Skin:    General: Skin is warm and dry.  Neurological:     Mental Status: He is alert.  Psychiatric:        Mood and Affect: Mood normal.     ED Results / Procedures / Treatments   Labs (all labs ordered are listed, but only abnormal results are displayed) Labs Reviewed - No data to display  EKG None  Radiology DG Shoulder Left Result Date: 06/13/2023 CLINICAL DATA:  Left shoulder pain and limited range of motion for 2 months.v EXAM: LEFT SHOULDER - 2+ VIEW COMPARISON:  None Available. FINDINGS: There is no evidence of fracture or  dislocation. Mild degenerative spurring is seen involving the inferior glenoid and acromioclavicular joint. IMPRESSION: No acute findings. Mild degenerative spurring involving the glenoid and acromioclavicular joint. Electronically Signed   By: Norleen DELENA Kil M.D.   On: 06/13/2023 10:43    Procedures Procedures    Medications Ordered in ED Medications - No data to display  ED Course/ Medical Decision Making/ A&P                                 Medical Decision Making 73 year old male with history as above presenting for subacute left shoulder pain.  No traumatic injury.  Exam notable for decreased range of motion with abduction and flexion of the left shoulder.  No external evidence of trauma.  Differential diagnosis  includes fracture, dislocation, osteoarthritis, adhesive capsulitis or muscle strain/pain.  X-ray shows some degenerative changes most consistent with osteoarthritis.  Will provide him with a number for orthopedic follow-up and instructed on symptomatic management.  Amount and/or Complexity of Data Reviewed Radiology: ordered.           Final Clinical Impression(s) / ED Diagnoses Final diagnoses:  Strain of left shoulder, initial encounter  Osteoarthritis of left shoulder, unspecified osteoarthritis type    Rx / DC Orders ED Discharge Orders     None         Pamella Ozell DELENA, DO 06/13/23 1207

## 2023-10-11 ENCOUNTER — Encounter (HOSPITAL_BASED_OUTPATIENT_CLINIC_OR_DEPARTMENT_OTHER): Payer: Self-pay | Admitting: Urology

## 2023-10-11 ENCOUNTER — Emergency Department (HOSPITAL_BASED_OUTPATIENT_CLINIC_OR_DEPARTMENT_OTHER): Payer: Medicare (Managed Care)

## 2023-10-11 ENCOUNTER — Emergency Department (HOSPITAL_BASED_OUTPATIENT_CLINIC_OR_DEPARTMENT_OTHER)
Admission: EM | Admit: 2023-10-11 | Discharge: 2023-10-11 | Disposition: A | Discharge status: Home / Self Care | Payer: Medicare (Managed Care) | Attending: Emergency Medicine | Admitting: Emergency Medicine

## 2023-10-11 DIAGNOSIS — R0602 Shortness of breath: Secondary | ICD-10-CM | POA: Diagnosis present

## 2023-10-11 DIAGNOSIS — J441 Chronic obstructive pulmonary disease with (acute) exacerbation: Secondary | ICD-10-CM | POA: Insufficient documentation

## 2023-10-11 DIAGNOSIS — R350 Frequency of micturition: Secondary | ICD-10-CM

## 2023-10-11 DIAGNOSIS — Z7982 Long term (current) use of aspirin: Secondary | ICD-10-CM | POA: Diagnosis not present

## 2023-10-11 DIAGNOSIS — Z79899 Other long term (current) drug therapy: Secondary | ICD-10-CM | POA: Insufficient documentation

## 2023-10-11 DIAGNOSIS — N183 Chronic kidney disease, stage 3 unspecified: Secondary | ICD-10-CM | POA: Insufficient documentation

## 2023-10-11 DIAGNOSIS — I129 Hypertensive chronic kidney disease with stage 1 through stage 4 chronic kidney disease, or unspecified chronic kidney disease: Secondary | ICD-10-CM | POA: Diagnosis not present

## 2023-10-11 LAB — BASIC METABOLIC PANEL WITH GFR
Anion gap: 12 (ref 5–15)
BUN: 7 mg/dL — ABNORMAL LOW (ref 8–23)
CO2: 23 mmol/L (ref 22–32)
Calcium: 8.7 mg/dL — ABNORMAL LOW (ref 8.9–10.3)
Chloride: 100 mmol/L (ref 98–111)
Creatinine, Ser: 1.2 mg/dL (ref 0.61–1.24)
GFR, Estimated: >60 mL/min (ref >60)
Glucose, Bld: 106 mg/dL — ABNORMAL HIGH (ref 70–99)
Potassium: 3.3 mmol/L — ABNORMAL LOW (ref 3.5–5.1)
Sodium: 135 mmol/L (ref 135–145)

## 2023-10-11 LAB — CBC
HCT: 35 % — ABNORMAL LOW (ref 39.0–52.0)
Hemoglobin: 12 g/dL — ABNORMAL LOW (ref 13.0–17.0)
MCH: 29.9 pg (ref 26.0–34.0)
MCHC: 34.3 g/dL (ref 30.0–36.0)
MCV: 87.3 fL (ref 80.0–100.0)
Platelets: 232 10*3/uL (ref 150–400)
RBC: 4.01 MIL/uL — ABNORMAL LOW (ref 4.22–5.81)
RDW: 14 % (ref 11.5–15.5)
WBC: 10 10*3/uL (ref 4.0–10.5)
nRBC: 0 % (ref 0.0–0.2)

## 2023-10-11 LAB — URINALYSIS, ROUTINE W REFLEX MICROSCOPIC
Bilirubin Urine: NEGATIVE
Glucose, UA: NEGATIVE mg/dL
Hgb urine dipstick: NEGATIVE
Ketones, ur: NEGATIVE mg/dL
Leukocytes,Ua: NEGATIVE
Nitrite: NEGATIVE
Protein, ur: NEGATIVE mg/dL
Specific Gravity, Urine: <=1.005 (ref 1.005–1.030)
pH: 5.5 (ref 5.0–8.0)

## 2023-10-11 LAB — TROPONIN T, HIGH SENSITIVITY: Troponin T High Sensitivity: 32 ng/L — ABNORMAL HIGH (ref <19)

## 2023-10-11 LAB — PRO BRAIN NATRIURETIC PEPTIDE: Pro Brain Natriuretic Peptide: 247 pg/mL (ref <300.0)

## 2023-10-11 LAB — CBG MONITORING, ED: Glucose-Capillary: 113 mg/dL — ABNORMAL HIGH (ref 70–99)

## 2023-10-11 MED ORDER — DOXYCYCLINE HYCLATE 100 MG PO CAPS: 100.0000 mg | ORAL_CAPSULE | Freq: Two times a day (BID) | ORAL | 0 refills | Status: AC

## 2023-10-11 MED ORDER — MAGNESIUM SULFATE 2 GM/50ML IV SOLN
2.0000 g | Freq: Once | INTRAVENOUS | Status: AC
  Administered 2023-10-11: 2 g via INTRAVENOUS
  Filled 2023-10-11: qty 50

## 2023-10-11 MED ORDER — BENZONATATE 100 MG PO CAPS: 100.0000 mg | ORAL_CAPSULE | Freq: Three times a day (TID) | ORAL | 0 refills | Status: AC | PRN

## 2023-10-11 MED ORDER — IPRATROPIUM BROMIDE 0.02 % IN SOLN
0.5000 mg | Freq: Once | RESPIRATORY_TRACT | Status: AC
  Administered 2023-10-11: 0.5 mg via RESPIRATORY_TRACT
  Filled 2023-10-11 (×2): qty 2.5

## 2023-10-11 MED ORDER — METHYLPREDNISOLONE SODIUM SUCC 125 MG IJ SOLR
125.0000 mg | Freq: Once | INTRAMUSCULAR | Status: AC
  Administered 2023-10-11: 125 mg via INTRAVENOUS
  Filled 2023-10-11: qty 2

## 2023-10-11 MED ORDER — ALBUTEROL SULFATE (2.5 MG/3ML) 0.083% IN NEBU
10.0000 mg | INHALATION_SOLUTION | Freq: Once | RESPIRATORY_TRACT | Status: AC
  Administered 2023-10-11: 10 mg via RESPIRATORY_TRACT
  Filled 2023-10-11: qty 12

## 2023-10-11 MED ORDER — PREDNISONE 20 MG PO TABS
40.0000 mg | ORAL_TABLET | Freq: Every day | ORAL | 0 refills | Status: AC
Start: 2023-10-12 — End: 2023-10-18

## 2023-10-11 MED ORDER — SODIUM CHLORIDE 0.9 % IV BOLUS: 1000.0000 mL | Freq: Once | INTRAVENOUS | Status: DC

## 2023-10-11 NOTE — ED Triage Notes (Signed)
 Pt states constantly urinating and increase in urgency and frequency that started this am at 0300  States feels dehydrated  States feels a little lightheaded and states needs a breathing treatment   H/o COPD
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
## 2023-10-11 NOTE — Discharge Instructions (Addendum)
 As discussed, your workup today was reassuring.  Chest x-ray did not show obvious pneumonia or other abnormality.  I suspect that your symptoms are likely related to a COPD flareup.  Will send home with prednisone  to take for the next several days as well as antibiotic.  Will send in cough medicine to use as needed.  Continue take breathing treatments as directed.  Recommend close follow-up with your primary care for reassessment.  Please do not hesitate to return to the emergency department if you develop any new or worsening symptoms.

## 2024-03-07 LAB — COLOGUARD: COLOGUARD: POSITIVE — AB
